# Patient Record
Sex: Male | Born: 1955 | Race: White | Hispanic: No | Marital: Single | Smoking: Former smoker
Health system: Southern US, Community
[De-identification: ages and names within clinical notes are randomized; demographics above are authoritative.]

## PROBLEM LIST (undated history)

## (undated) DIAGNOSIS — F209 Schizophrenia, unspecified: Secondary | ICD-10-CM

## (undated) DIAGNOSIS — E119 Type 2 diabetes mellitus without complications: Secondary | ICD-10-CM

## (undated) DIAGNOSIS — I10 Essential (primary) hypertension: Secondary | ICD-10-CM

## (undated) DIAGNOSIS — S88111A Complete traumatic amputation at level between knee and ankle, right lower leg, initial encounter: Secondary | ICD-10-CM

## (undated) DIAGNOSIS — R350 Frequency of micturition: Secondary | ICD-10-CM

## (undated) DIAGNOSIS — G629 Polyneuropathy, unspecified: Secondary | ICD-10-CM

## (undated) HISTORY — PX: BELOW KNEE LEG AMPUTATION: SUR23

---

## 2019-03-11 ENCOUNTER — Emergency Department (HOSPITAL_COMMUNITY)
Admission: EM | Admit: 2019-03-11 | Discharge: 2019-03-15 | Disposition: A | Discharge status: Home / Self Care | Payer: Medicare (Managed Care) | Attending: Emergency Medicine | Admitting: Emergency Medicine

## 2019-03-11 ENCOUNTER — Emergency Department (HOSPITAL_COMMUNITY): Payer: Medicare (Managed Care)

## 2019-03-11 ENCOUNTER — Encounter (HOSPITAL_COMMUNITY): Payer: Self-pay

## 2019-03-11 DIAGNOSIS — F333 Major depressive disorder, recurrent, severe with psychotic symptoms: Secondary | ICD-10-CM | POA: Diagnosis not present

## 2019-03-11 DIAGNOSIS — L97529 Non-pressure chronic ulcer of other part of left foot with unspecified severity: Secondary | ICD-10-CM | POA: Insufficient documentation

## 2019-03-11 DIAGNOSIS — R0600 Dyspnea, unspecified: Secondary | ICD-10-CM | POA: Diagnosis present

## 2019-03-11 DIAGNOSIS — Z5900 Homelessness unspecified: Secondary | ICD-10-CM

## 2019-03-11 DIAGNOSIS — R45851 Suicidal ideations: Secondary | ICD-10-CM

## 2019-03-11 DIAGNOSIS — Z20822 Contact with and (suspected) exposure to covid-19: Secondary | ICD-10-CM | POA: Diagnosis not present

## 2019-03-11 DIAGNOSIS — Z794 Long term (current) use of insulin: Secondary | ICD-10-CM | POA: Diagnosis not present

## 2019-03-11 DIAGNOSIS — E1140 Type 2 diabetes mellitus with diabetic neuropathy, unspecified: Secondary | ICD-10-CM | POA: Diagnosis not present

## 2019-03-11 DIAGNOSIS — Z79899 Other long term (current) drug therapy: Secondary | ICD-10-CM | POA: Insufficient documentation

## 2019-03-11 DIAGNOSIS — Z89511 Acquired absence of right leg below knee: Secondary | ICD-10-CM | POA: Diagnosis not present

## 2019-03-11 DIAGNOSIS — Z7951 Long term (current) use of inhaled steroids: Secondary | ICD-10-CM | POA: Insufficient documentation

## 2019-03-11 DIAGNOSIS — E11621 Type 2 diabetes mellitus with foot ulcer: Secondary | ICD-10-CM | POA: Diagnosis not present

## 2019-03-11 DIAGNOSIS — I10 Essential (primary) hypertension: Secondary | ICD-10-CM | POA: Diagnosis not present

## 2019-03-11 DIAGNOSIS — F17210 Nicotine dependence, cigarettes, uncomplicated: Secondary | ICD-10-CM | POA: Insufficient documentation

## 2019-03-11 DIAGNOSIS — E119 Type 2 diabetes mellitus without complications: Secondary | ICD-10-CM | POA: Insufficient documentation

## 2019-03-11 DIAGNOSIS — R0602 Shortness of breath: Secondary | ICD-10-CM

## 2019-03-11 LAB — COMPREHENSIVE METABOLIC PANEL
ALT: 14 U/L (ref 0–44)
AST: 13 U/L — ABNORMAL LOW (ref 15–41)
Albumin: 3.7 g/dL (ref 3.5–5.0)
Alkaline Phosphatase: 133 U/L — ABNORMAL HIGH (ref 38–126)
Anion gap: 11 (ref 5–15)
BUN: 19 mg/dL (ref 8–23)
CO2: 28 mmol/L (ref 22–32)
Calcium: 9.4 mg/dL (ref 8.9–10.3)
Chloride: 99 mmol/L (ref 98–111)
Creatinine, Ser: 1.04 mg/dL (ref 0.61–1.24)
GFR calc Af Amer: >60 mL/min (ref >60)
GFR calc non Af Amer: >60 mL/min (ref >60)
Glucose, Bld: 431 mg/dL — ABNORMAL HIGH (ref 70–99)
Potassium: 4.9 mmol/L (ref 3.5–5.1)
Sodium: 138 mmol/L (ref 135–145)
Total Bilirubin: 0.9 mg/dL (ref 0.3–1.2)
Total Protein: 8.5 g/dL — ABNORMAL HIGH (ref 6.5–8.1)

## 2019-03-11 LAB — CBC
HCT: 50 % (ref 39.0–52.0)
Hemoglobin: 16.7 g/dL (ref 13.0–17.0)
MCH: 29.5 pg (ref 26.0–34.0)
MCHC: 33.4 g/dL (ref 30.0–36.0)
MCV: 88.2 fL (ref 80.0–100.0)
Platelets: 298 10*3/uL (ref 150–400)
RBC: 5.67 MIL/uL (ref 4.22–5.81)
RDW: 12.4 % (ref 11.5–15.5)
WBC: 10.2 10*3/uL (ref 4.0–10.5)
nRBC: 0 % (ref 0.0–0.2)

## 2019-03-11 LAB — ACETAMINOPHEN LEVEL: Acetaminophen (Tylenol), Serum: <10 ug/mL — ABNORMAL LOW (ref 10–30)

## 2019-03-11 LAB — RESPIRATORY PANEL BY RT PCR (FLU A&B, COVID)
Influenza A by PCR: NEGATIVE
Influenza B by PCR: NEGATIVE
SARS Coronavirus 2 by RT PCR: NEGATIVE

## 2019-03-11 LAB — ETHANOL: Alcohol, Ethyl (B): <10 mg/dL (ref <10)

## 2019-03-11 LAB — CBG MONITORING, ED
Glucose-Capillary: 347 mg/dL — ABNORMAL HIGH (ref 70–99)
Glucose-Capillary: 288 mg/dL — ABNORMAL HIGH (ref 70–99)

## 2019-03-11 LAB — RAPID URINE DRUG SCREEN, HOSP PERFORMED
Amphetamines: NOT DETECTED
Barbiturates: NOT DETECTED
Benzodiazepines: NOT DETECTED
Cocaine: NOT DETECTED
Opiates: NOT DETECTED
Tetrahydrocannabinol: NOT DETECTED

## 2019-03-11 LAB — SALICYLATE LEVEL: Salicylate Lvl: <7 mg/dL — ABNORMAL LOW (ref 7.0–30.0)

## 2019-03-11 MED ORDER — BACLOFEN 10 MG PO TABS
10.0000 mg | ORAL_TABLET | Freq: Three times a day (TID) | ORAL | Status: DC | PRN
  Administered 2019-03-11 – 2019-03-14 (×7): 10 mg via ORAL
  Filled 2019-03-11 (×7): qty 1

## 2019-03-11 MED ORDER — GABAPENTIN 300 MG PO CAPS
600.0000 mg | ORAL_CAPSULE | Freq: Two times a day (BID) | ORAL | Status: DC | PRN
  Administered 2019-03-11 – 2019-03-15 (×8): 600 mg via ORAL
  Filled 2019-03-11 (×10): qty 2

## 2019-03-11 MED ORDER — CALCIUM CARBONATE ANTACID 500 MG PO CHEW: 1.0000 | CHEWABLE_TABLET | Freq: Two times a day (BID) | ORAL | Status: DC | PRN

## 2019-03-11 MED ORDER — FINASTERIDE 5 MG PO TABS
5.0000 mg | ORAL_TABLET | Freq: Every day | ORAL | Status: DC
  Administered 2019-03-11 – 2019-03-15 (×5): 5 mg via ORAL
  Filled 2019-03-11 (×5): qty 1

## 2019-03-11 MED ORDER — METFORMIN HCL 500 MG PO TABS
500.0000 mg | ORAL_TABLET | Freq: Every day | ORAL | Status: DC
  Administered 2019-03-12 – 2019-03-15 (×4): 500 mg via ORAL
  Filled 2019-03-11 (×4): qty 1

## 2019-03-11 MED ORDER — INSULIN ASPART 100 UNIT/ML ~~LOC~~ SOLN
15.0000 [IU] | Freq: Three times a day (TID) | SUBCUTANEOUS | Status: DC
  Administered 2019-03-11 – 2019-03-15 (×11): 15 [IU] via SUBCUTANEOUS
  Filled 2019-03-11: qty 0.15

## 2019-03-11 MED ORDER — MOMETASONE FURO-FORMOTEROL FUM 200-5 MCG/ACT IN AERO
2.0000 | INHALATION_SPRAY | Freq: Two times a day (BID) | RESPIRATORY_TRACT | Status: DC
  Administered 2019-03-12 – 2019-03-15 (×3): 2 via RESPIRATORY_TRACT
  Filled 2019-03-11: qty 8.8

## 2019-03-11 MED ORDER — NICOTINE POLACRILEX 2 MG MT GUM: 2.0000 mg | CHEWING_GUM | OROMUCOSAL | Status: DC | PRN

## 2019-03-11 MED ORDER — ACETAMINOPHEN 500 MG PO TABS
500.0000 mg | ORAL_TABLET | Freq: Four times a day (QID) | ORAL | Status: DC | PRN
  Administered 2019-03-11 – 2019-03-15 (×9): 500 mg via ORAL
  Filled 2019-03-11 (×9): qty 1

## 2019-03-11 MED ORDER — DULOXETINE HCL 30 MG PO CPEP
30.0000 mg | ORAL_CAPSULE | Freq: Every day | ORAL | Status: DC
  Administered 2019-03-11 – 2019-03-15 (×5): 30 mg via ORAL
  Filled 2019-03-11 (×6): qty 1

## 2019-03-11 MED ORDER — ALBUTEROL SULFATE HFA 108 (90 BASE) MCG/ACT IN AERS: 2.0000 | INHALATION_SPRAY | Freq: Four times a day (QID) | RESPIRATORY_TRACT | Status: DC | PRN

## 2019-03-11 MED ORDER — CARVEDILOL 12.5 MG PO TABS
12.5000 mg | ORAL_TABLET | Freq: Two times a day (BID) | ORAL | Status: DC
  Administered 2019-03-11 – 2019-03-15 (×7): 12.5 mg via ORAL
  Filled 2019-03-11 (×8): qty 1

## 2019-03-11 NOTE — ED Provider Notes (Addendum)
[facility] DEPT Provider Note   CSN: [id number] Arrival date & time: 03/11/19  1237     History Chief Complaint  Patient presents with  . Suicidal    Darren West is a 64 y.o. male.  HPI    Patient presents with some suicidal ideation.  On he is not a forthcoming historian, answers most questions with very brief answers, does not elaborate on duration of his condition.  Seems though the patient was in a hotel, and when asked to leave, was clear that he had suicidal ideation.  No report of trauma, fall.  Patient notes he has pain in his shoulder where there is known rotator cuff the lesion, denies other new pain.  He does acknowledge dyspnea, unclear onset, unclear characteristics. Patient is seemingly patient at the [facility], it is unclear where else he obtains medical care.  Per EMS the patient reported suicidal ideation at a local hotel, and was brought here for evaluation.   History not provided by the patient in regards to social, family, medical, surgical.  However, on pharmacy reconciliation as per the patient's take multiple medication consistent with a history of respiratory disease, pain, diabetes, hypertension.   Social History   Patient does not provide details of his social history, but it seems as though he was living in a hotel prior to today.   Tobacco Use  . Smoking status: Not on file  Substance Use Topics  . Alcohol use: Not on file  . Drug use: Not on file    Home Medications Prior to Admission medications   Medication Sig Start Date End Date Taking? Authorizing Provider  acetaminophen (TYLENOL) 500 MG tablet Take 500 mg by mouth every 6 (six) hours as needed for mild pain.   Yes [provider]  albuterol (VENTOLIN HFA) 108 (90 Base) MCG/ACT inhaler Inhale 2 puffs into the lungs every 6 (six) hours as needed for wheezing or shortness of breath.   Yes [provider]  baclofen (LIORESAL) 10 MG  tablet Take 10 mg by mouth 3 (three) times daily as needed for muscle spasms.   Yes [provider]  budesonide-formoterol (SYMBICORT) 160-4.5 MCG/ACT inhaler Inhale 2 puffs into the lungs 2 (two) times daily.   Yes [provider]  calcium carbonate (TUMS - DOSED IN MG ELEMENTAL CALCIUM) 500 MG chewable tablet Chew 1 tablet by mouth 2 (two) times daily as needed for indigestion or heartburn.   Yes [provider]  carvedilol (COREG) 25 MG tablet Take 12.5 mg by mouth every 12 (twelve) hours.   Yes [provider]  DULoxetine (CYMBALTA) 30 MG capsule Take 30 mg by mouth daily.   Yes [provider]  finasteride (PROSCAR) 5 MG tablet Take 5 mg by mouth daily.   Yes [provider]  gabapentin (NEURONTIN) 300 MG capsule Take 600 mg by mouth 2 (two) times daily as needed (pain).   Yes [provider]  insulin aspart (NOVOLOG) 100 UNIT/ML injection Inject 15 Units into the skin 3 (three) times daily before meals.   Yes [provider]  ipratropium-albuterol (DUONEB) 0.5-2.5 (3) MG/3ML SOLN Take 3 mLs by nebulization every 4 (four) hours as needed (wheezing/SOB).   Yes [provider]  metFORMIN (GLUCOPHAGE) 500 MG tablet Take 500 mg by mouth daily with breakfast.   Yes [provider]  nicotine polacrilex (COMMIT) 2 MG lozenge Take 2 mg by mouth as needed for smoking cessation.   Yes [provider]  OVER THE COUNTER MEDICATION Apply 1 application topically 4 (four) times daily as needed.   Yes [provider]  tamsulosin (FLOMAX) 0.4 MG CAPS capsule Take 0.4 mg by mouth daily after supper.   Yes [provider]    Allergies    Patient has no known allergies.  Review of Systems   Review of Systems  Unable to perform ROS: Psychiatric disorder    Physical Exam Updated Vital Signs BP (!) 167/113 (BP Location: Right Arm)   Pulse (!) 104   Temp 98 F (36.7 C) (Oral)   Resp 18    SpO2 95%   Physical Exam Vitals and nursing note reviewed.  Constitutional:      Appearance: He is well-developed.     Comments: Unwell appearing, though no distress adult male speaking briefly, minimally, with delay and only intermittently.  HENT:     Head: Normocephalic and atraumatic.  Eyes:     Conjunctiva/sclera: Conjunctivae normal.  Pulmonary:     Effort: Pulmonary effort is normal. No respiratory distress.     Breath sounds: No stridor.  Abdominal:     General: There is no distension.  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  Skin:    General: Skin is warm and dry.  Neurological:     Mental Status: He is alert and oriented to person, place, and time.  Psychiatric:        Thought Content: Thought content includes suicidal ideation.     ED Results / Procedures / Treatments   Labs (all labs ordered are listed, but only abnormal results are displayed) Labs Reviewed  COMPREHENSIVE METABOLIC PANEL - Abnormal; Notable for the following components:      Result Value   Glucose, Bld 431 (*)    Total Protein 8.5 (*)    AST 13 (*)    Alkaline Phosphatase 133 (*)    All other components within normal limits  SALICYLATE LEVEL - Abnormal; Notable for the following components:   Salicylate Lvl <7.0 (*)    All other components within normal limits  ACETAMINOPHEN LEVEL - Abnormal; Notable for the following components:   Acetaminophen (Tylenol), Serum <10 (*)    All other components within normal limits  RESPIRATORY PANEL BY RT PCR (FLU A&B, COVID)  ETHANOL  CBC  RAPID URINE DRUG SCREEN, HOSP PERFORMED     Radiology DG Chest Port 1 View  Result Date: 03/11/2019 CLINICAL DATA:  Pt presents with EMS with c/o SI. Per EMS, pt reports that he was at a hotel and hotel management wanted him gone. EMS reported that he told them he was SI when they attempted to make him leave. Pt is not very cooperative when an.*comment was truncated* EXAM:  PORTABLE CHEST 1 VIEW COMPARISON:  None. FINDINGS: Normal mediastinum and cardiac silhouette. Low lung volumes. Normal pulmonary vasculature. No evidence of effusion, infiltrate, or pneumothorax. No acute bony abnormality. IMPRESSION: No acute cardiopulmonary process. Electronically Signed   By: Genevive Bi M.D.   On: 03/11/2019 13:39    Procedures Procedures (including critical care time)  Medications Ordered in ED Medications - No data to display  ED Course  I have reviewed the triage vital signs and the nursing notes.  Pertinent labs & imaging results that were available during my care of the patient were reviewed by me and considered in my medical decision making (see chart for details).   Patient has hyperG, but no aniongap.  He was restarted  on his diabetes meds.    Large adult male presents with concern for suicidal ideation. On exam patient is in no distress, though is not particularly forthcoming with any details of today's episode.  Patient has no evidence for acute new physical disease, has no hemodynamic stability, labs are reassuring, though is noted, on medication reconciliation that he likely has multiple medical issues which will require ongoing care.  Patient has been medically cleared, however, for behavioral health evaluation for his description of suicidal ideation.  Final Clinical Impression(s) / ED Diagnoses Final diagnoses:  SOB (shortness of breath)  Suicidal ideation      Gerhard Munch, MD 03/11/19 3568    Gerhard Munch, MD 03/11/19 [phone]

## 2019-03-11 NOTE — ED Notes (Signed)
Pt has a wound on bottom left foot. He will not let staff touch or dress it.

## 2019-03-11 NOTE — ED Triage Notes (Addendum)
Pt presents with EMS with c/o SI. Per EMS, pt reports that he was at a hotel and hotel management wanted him gone. EMS reported that he told them he was SI when they attempted to make him leave. Pt is not very cooperative when answering questions, asking this RN to keep her voice down because he is on the phone. Pt does admit to SI.
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
## 2019-03-12 DIAGNOSIS — Z5900 Homelessness unspecified: Secondary | ICD-10-CM

## 2019-03-12 LAB — CBG MONITORING, ED
Glucose-Capillary: 324 mg/dL — ABNORMAL HIGH (ref 70–99)
Glucose-Capillary: 390 mg/dL — ABNORMAL HIGH (ref 70–99)
Glucose-Capillary: 276 mg/dL — ABNORMAL HIGH (ref 70–99)
Glucose-Capillary: 312 mg/dL — ABNORMAL HIGH (ref 70–99)

## 2019-03-12 MED ORDER — MUSCLE RUB 10-15 % EX CREA
TOPICAL_CREAM | CUTANEOUS | Status: DC | PRN
  Administered 2019-03-12: 1 via TOPICAL
  Filled 2019-03-12: qty 85

## 2019-03-12 NOTE — BH Assessment (Addendum)
[facility] Assessment Progress Note  Per Berneice Heinrich, FNP, this pt does not require psychiatric hospitalization at this time.  Pt is to be discharged from [facility] with recommendation to follow up with [facility].  This has been included in pt's discharge instructions, along with referral information for VA services in the area.  Pt's nurse, Waynetta Sandy, has been notified.  Doylene Canning, MA Triage Specialist [phone]

## 2019-03-12 NOTE — Progress Notes (Signed)
CSW updated the EDP on duty 2nd shift the the pt's supposed ride to a hotel in [place] is no longer in communication with the CSW.  CSW also updated the EPD that it is also hoped for but unconfirmed that pt's alleged payor for the pt's hotel room will pay for a room for the pt to go to in [place] once the pt's payor (friend who has agreed to pay per the 1st shift ED CSW) Darren West at ph: ph: [phone] gets off work at [employer].  EDP updated that per 1st shift EDP this was considered to be the plan for the pt but that it is as of now unconfirmed.  EDP will keep pt overnight for safety with the plan for the 1st shift ED CSW to follow up.  CSW was able to, with the help of the RN CM attain a walker for the pt.  Per pt's friend Darren West who is now no longer answering phone calls from the CSW the did have an electrical wheelchair prior to the pt coming to the ED.  Per pt's RN, pt has a prosthesis.  2nd shift ED CSW will leave handoff for 1st shift ED CSW.  CSW will continue to follow for D/C needs.  Darren Pea. Akelia Husted, LCSW, LCAS, CSI Transitions of Care Clinical Social Worker Care Coordination Department Ph: [phone]

## 2019-03-12 NOTE — ED Provider Notes (Signed)
Emergency Medicine Observation Re-evaluation Note  Darren West is a 64 y.o. male, seen on rounds today.  Pt initially presented to the ED for complaints of Suicidal Currently, the patient is sleeping.  Physical Exam  BP (!) 151/107 (BP Location: Right Arm)   Pulse 95   Temp 98.8 F (37.1 C) (Oral)   Resp 20   SpO2 93%  Physical Exam  ED Course / MDM  EKG:    I have reviewed the labs performed to date as well as medications administered while in observation.  Recent changes in the last 24 hours include:  none Plan  Current plan is for reevaluation this morning.    Sabas Sous, MD 03/12/19 [phone]

## 2019-03-12 NOTE — ED Provider Notes (Signed)
Patient is medically cleared to be discharged to a hotel later today or tomorrow morning.   Bethann Berkshire, MD 03/12/19 [phone]

## 2019-03-12 NOTE — Progress Notes (Signed)
CSW received a call from pt's friend Karen Chafe at ph: [phone].   states he would be willing to pay for a room if transport to the [facility] were to be provided on 03/10/19 at anytime after 3am.  CSW unable to contact pt';s friend Darryl who previously stated he would transport pt at ph: ph: [phone]  CSW will continue to follow for D/C needs.  Dorothe Pea. Tasmine Hipwell, LCSW, LCAS, CSI Transitions of Care Clinical Social Worker Care Coordination Department Ph: [phone]

## 2019-03-12 NOTE — Progress Notes (Addendum)
CSW viewed pt through the glass front of the pt's room immediately upon hearing CSW was a Education officer, museum pt's legs began moving with "involuntary jerking movements".  CSW met with pt who provided verbal permission for the CSW to contact his friends Darryl Quita Skye) Vertell Limber at ph: [phone] and Redmond Pulling at ph: [phone].    CSW spoke to pt's friend Darryl who works until 8 pm who confirmed that pt's friend Delight Stare works until [employer]  Per the pt/pt's friend Reita Cliche, pt's friend Delight Stare is to leave work at [employer] and to go in person Gwyndolyn Saxon supposedly refuses to provide a card over the phone) to the [facility] at 3 am to pay for the room in person for the pt.  Plan: 3 am: RN please call pt's friend Delight Stare at ph: ph: [phone] to see when the pt's hotel room in [place] will be paid for.  Pt's friend Darryl originally told the CSW that Darryl could transport the pt, but when the CSW called to confirm this Darryl hung up on the CSW and did not answer when the CSW called back X3.  CSW called/text vis HIPPA-compliant messaging that this plan is confirmed with Delight Stare and is waiting to hear back.  Of note: Per p/pt's friend Darryl, Mr. Delight Stare is working and cannot answer his phone until after 3 am.  CSW will continue to follow for D/C needs.  Alphonse Guild. Kiano Terrien, LCSW, LCAS, CSI Transitions of Care Clinical Social Worker Care Coordination Department Ph: [phone]

## 2019-03-12 NOTE — Discharge Instructions (Addendum)
For your behavioral health needs you are advised to follow up with one of the providers listed below at your earliest opportunity:         [facility]      [address]      [place], [place] [address]      [phone]      New patients are seen at their walk-in clinic.  Walk-in hours are Monday - Friday from 8:30 am - 12:00 pm, and from 1:00 pm - 2:30 pm.  Walk-in patients are seen on a first come, first served basis, so try to arrive as early as possible for the best chance of being seen the same day.       [facility]      [address] [place], [place] [address]      [phone]  For supportive services for veterans in the [place] area, contact the [facility]:       [facility]      [address], [address]      [place], [place] [address]      [phone]      Hours of operation: 8:00 am - 7:00 pm, Monday - Friday  For crisis services for veterans, call the [employer], available 24 hours a day, 7 days a week:       [employer] [facility]      [phone]

## 2019-03-12 NOTE — Progress Notes (Addendum)
Assessed left ball of foot diabetic ulcer. Dr. Preston Fleeting came to assess the foot ulcer as well. He stated that we wound not place any additional orders for the wound but to keep an eye on the ulcer.

## 2019-03-12 NOTE — Progress Notes (Addendum)
1:30p CSW assisting patient in trying to get in contact with family to help him find a place to stay. Patient gave CSW permissions to speak to a friend Amada Jupiter [phone]) who reports he has spoke with patient's other friend Andrey Campanile [phone]) and Andrey Campanile is going to help patient get a room at the [facility]. CSW spoke with Andrey Campanile who confirmed this, but reports he has not spoken with the hotel and the room has not been paid for. Andrey Campanile reports patient has family who lives in [place] who may be able to come pick patient up. Andrey Campanile reports he is unable to pick patient up as he is on his way to work and Amada Jupiter is currently at work. CSW to have patient try to call family in the meantime. Patient may not be safe to just be transported to the [facility] in without things being in place. Will continue to follow up.  10:46a TOC consult received regarding patient needing assistance with homeless resources. CSW contacted [facility] prior to speaking with patient and was informed that there are no current male shelter beds available. CSW spoke with patient at bedside. When CSW introduced herself and asked patient about his needs patient stated that his bank in [place] is going to forward him money so he can "find somewhere to stay in this [place] town." When CSW asked further questions about when he will get the money and where he plans on going patient became verbally agitated. 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 RN report she will attempt to go speak to patient again once he calms down and follow up with CSW. Will continue to follow up.   Geralyn Corwin, LCSW Transitions of Care Department [facility] ED [phone]

## 2019-03-12 NOTE — ED Notes (Signed)
Pt became very agitated at the writer, when told that his friend would not be picking him up this evening. The pt throw the phone and began to be verbally  aggressive. I will continue to monitor and redirect the pt. Christiane Ha from social work did come to speak with the pt, and he now  seems to have a understanding about his situation at this time and has accepted that he must stay over night.

## 2019-03-12 NOTE — Progress Notes (Addendum)
CSW viewed pt through the glass and pt was resting comfortably and seemingly asleep.  CSW entered the room, woke the pt up and reintroduced himself as the Child psychotherapist.  Immediately pt's body and leg (pt's right leg BTK amputation) began "involuntarily" moving in a jerking motion.  CSW updated pt who provided CSW with the following contacts:  1. Pt's aunt Inis Sizer at ph: [phone]  CSW called pt's aunt and each time the phone was answered but no one spoke or answered the CSW's queries.  2. Pt's brother Jeralyn Bennett at ph: [phone]  CSW called pt's brother Jeralyn Bennett and left a HIPPA-compliant VM requesting a call back.  CSW will continue to follow for D/C needs.  Dorothe Pea. Glenyce Randle, LCSW, LCAS, CSI Transitions of Care Clinical Social Worker Care Coordination Department Ph: [phone]
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
## 2019-03-13 LAB — CBG MONITORING, ED
Glucose-Capillary: 266 mg/dL — ABNORMAL HIGH (ref 70–99)
Glucose-Capillary: 380 mg/dL — ABNORMAL HIGH (ref 70–99)
Glucose-Capillary: 294 mg/dL — ABNORMAL HIGH (ref 70–99)

## 2019-03-13 NOTE — Progress Notes (Signed)
03/13/2019  1215  Tried calling patient's friend [phone] to see about what time he plans on coming to get patient. Patient's friend did not answer.

## 2019-03-13 NOTE — Progress Notes (Addendum)
1pm: CSW made two additional attempts to reach the patient's friend Chrissie Noa without success, an additional voicemail was left.  8:45am: CSW attempted to reach patient's friend Karen Chafe at [phone] without success, a voicemail was left requesting a return call.  Edwin Dada, MSW, LCSW-A Transitions of Care  Clinical Social Worker  [facility] Emergency Departments  Medical ICU [phone]

## 2019-03-14 LAB — CBG MONITORING, ED
Glucose-Capillary: 293 mg/dL — ABNORMAL HIGH (ref 70–99)
Glucose-Capillary: 393 mg/dL — ABNORMAL HIGH (ref 70–99)
Glucose-Capillary: 307 mg/dL — ABNORMAL HIGH (ref 70–99)
Glucose-Capillary: 379 mg/dL — ABNORMAL HIGH (ref 70–99)

## 2019-03-14 MED ORDER — ZOLPIDEM TARTRATE 5 MG PO TABS
5.0000 mg | ORAL_TABLET | Freq: Every evening | ORAL | Status: DC | PRN
  Administered 2019-03-14 (×2): 5 mg via ORAL
  Filled 2019-03-14 (×2): qty 1

## 2019-03-14 NOTE — ED Notes (Signed)
MD aware of CBG 379.

## 2019-03-14 NOTE — Progress Notes (Addendum)
8:45am- CSW attempted to reach patient's friend Karen Chafe at [phone]. The call was unanswered, CSW left a voicemail requesting a callback.  8:50am- CSW attempted to reach patient's friend Darryl at [phone].The call was unanswered, CSW left a voicemail requesting a callback.  Chrissie Noa previously offered to pay for a hotel and Darryl previously offered to provide transportation for this patient to [place], [place] at discharge.  8:55am- CSW attempted to reach [facility]. The call was unanswered and the mailbox is unable to accept new messages at this time. Shelters do not typically facilitate intake on weekends.  9:40am- CSW left voicemail for [facility] Supervisor on call, Jiles Crocker.   9:50am- CSW received a returned call from [facility] Supervisor, Jiles Crocker. Case was staffed with [facility] Supervisor who states there needs to be a safe discharge plan for this patient. CSW was encouraged to continue contact attempts to reach supports in an effort to facilitate a safe discharge.     1:50pm- CSW attempted to reach patient's friend Karen Chafe at [phone]. The call was unanswered, the voicemail box is now full and CSW could not leave a message.  1:55pm-  CSW attempted to reach [facility]. The call was unanswered and the mailbox is unable to accept new messages at this time.     3:35pm- CSW attempted to reach patient's friend Karen Chafe at [phone]. The call was unanswered, the voicemail box is now full and CSW could not leave a message.  3:35pm-  CSW attempted to reach [facility]. The call was unanswered and the mailbox is unable to accept new messages at this time.   CSW following.  Enid Cutter, MSW, LCSW-A Clinical Social Worker

## 2019-03-15 ENCOUNTER — Other Ambulatory Visit: Payer: Self-pay

## 2019-03-15 ENCOUNTER — Emergency Department (HOSPITAL_COMMUNITY)
Admission: EM | Admit: 2019-03-15 | Discharge: 2019-03-16 | Disposition: A | Discharge status: Home / Self Care | Payer: Medicare (Managed Care) | Source: Home / Self Care | Attending: Emergency Medicine | Admitting: Emergency Medicine

## 2019-03-15 ENCOUNTER — Emergency Department (HOSPITAL_COMMUNITY)
Admission: EM | Admit: 2019-03-15 | Discharge: 2019-03-15 | Payer: Medicare (Managed Care) | Source: Home / Self Care | Attending: Emergency Medicine | Admitting: Emergency Medicine

## 2019-03-15 ENCOUNTER — Encounter (HOSPITAL_COMMUNITY): Payer: Self-pay | Admitting: Emergency Medicine

## 2019-03-15 ENCOUNTER — Ambulatory Visit (HOSPITAL_COMMUNITY): Admission: RE | Admit: 2019-03-15 | Payer: Medicare (Managed Care) | Source: Home / Self Care | Admitting: Psychiatry

## 2019-03-15 DIAGNOSIS — Z794 Long term (current) use of insulin: Secondary | ICD-10-CM | POA: Insufficient documentation

## 2019-03-15 DIAGNOSIS — F333 Major depressive disorder, recurrent, severe with psychotic symptoms: Secondary | ICD-10-CM | POA: Insufficient documentation

## 2019-03-15 DIAGNOSIS — T887XXA Unspecified adverse effect of drug or medicament, initial encounter: Secondary | ICD-10-CM

## 2019-03-15 DIAGNOSIS — Z79899 Other long term (current) drug therapy: Secondary | ICD-10-CM | POA: Insufficient documentation

## 2019-03-15 DIAGNOSIS — R45851 Suicidal ideations: Secondary | ICD-10-CM | POA: Insufficient documentation

## 2019-03-15 DIAGNOSIS — R739 Hyperglycemia, unspecified: Secondary | ICD-10-CM

## 2019-03-15 DIAGNOSIS — E119 Type 2 diabetes mellitus without complications: Secondary | ICD-10-CM | POA: Insufficient documentation

## 2019-03-15 DIAGNOSIS — Z89511 Acquired absence of right leg below knee: Secondary | ICD-10-CM | POA: Insufficient documentation

## 2019-03-15 DIAGNOSIS — R441 Visual hallucinations: Secondary | ICD-10-CM

## 2019-03-15 DIAGNOSIS — Z590 Homelessness: Secondary | ICD-10-CM | POA: Insufficient documentation

## 2019-03-15 DIAGNOSIS — Z765 Malingerer [conscious simulation]: Secondary | ICD-10-CM | POA: Insufficient documentation

## 2019-03-15 HISTORY — DX: Frequency of micturition: R35.0

## 2019-03-15 HISTORY — DX: Type 2 diabetes mellitus without complications: E11.9

## 2019-03-15 HISTORY — DX: Essential (primary) hypertension: I10

## 2019-03-15 HISTORY — DX: Schizophrenia, unspecified: F20.9

## 2019-03-15 HISTORY — DX: Polyneuropathy, unspecified: G62.9

## 2019-03-15 HISTORY — DX: Complete traumatic amputation at level between knee and ankle, right lower leg, initial encounter: S88.111A

## 2019-03-15 LAB — SALICYLATE LEVEL: Salicylate Lvl: <7 mg/dL — ABNORMAL LOW (ref 7.0–30.0)

## 2019-03-15 LAB — COMPREHENSIVE METABOLIC PANEL
ALT: 10 U/L (ref 0–44)
AST: 11 U/L — ABNORMAL LOW (ref 15–41)
Albumin: 3.2 g/dL — ABNORMAL LOW (ref 3.5–5.0)
Alkaline Phosphatase: 116 U/L (ref 38–126)
Anion gap: 12 (ref 5–15)
BUN: 26 mg/dL — ABNORMAL HIGH (ref 8–23)
CO2: 24 mmol/L (ref 22–32)
Calcium: 9.4 mg/dL (ref 8.9–10.3)
Chloride: 94 mmol/L — ABNORMAL LOW (ref 98–111)
Creatinine, Ser: 1.25 mg/dL — ABNORMAL HIGH (ref 0.61–1.24)
GFR calc Af Amer: >60 mL/min (ref >60)
GFR calc non Af Amer: >60 mL/min (ref >60)
Glucose, Bld: 505 mg/dL (ref 70–99)
Potassium: 5.1 mmol/L (ref 3.5–5.1)
Sodium: 130 mmol/L — ABNORMAL LOW (ref 135–145)
Total Bilirubin: 0.8 mg/dL (ref 0.3–1.2)
Total Protein: 7.3 g/dL (ref 6.5–8.1)

## 2019-03-15 LAB — CBC
HCT: 50.5 % (ref 39.0–52.0)
Hemoglobin: 16.9 g/dL (ref 13.0–17.0)
MCH: 29.6 pg (ref 26.0–34.0)
MCHC: 33.5 g/dL (ref 30.0–36.0)
MCV: 88.4 fL (ref 80.0–100.0)
Platelets: 303 10*3/uL (ref 150–400)
RBC: 5.71 MIL/uL (ref 4.22–5.81)
RDW: 12.4 % (ref 11.5–15.5)
WBC: 9.2 10*3/uL (ref 4.0–10.5)
nRBC: 0 % (ref 0.0–0.2)

## 2019-03-15 LAB — CBG MONITORING, ED: Glucose-Capillary: 341 mg/dL — ABNORMAL HIGH (ref 70–99)

## 2019-03-15 LAB — ACETAMINOPHEN LEVEL: Acetaminophen (Tylenol), Serum: <10 ug/mL — ABNORMAL LOW (ref 10–30)

## 2019-03-15 LAB — ETHANOL: Alcohol, Ethyl (B): <10 mg/dL (ref <10)

## 2019-03-15 NOTE — ED Notes (Signed)
Pt asked to move to recliner because of fall concerns, pt refused and stayed in wheelchair

## 2019-03-15 NOTE — Discharge Instructions (Addendum)
Follow up as previously directed

## 2019-03-15 NOTE — ED Provider Notes (Signed)
Emergency Medicine Observation Re-evaluation Note  Darren West is a 64 y.o. male, seen on rounds today.  Pt initially presented to the ED for complaints of Suicidal Currently, the patient is stable Physical Exam  BP (!) 132/95 (BP Location: Right Wrist)   Pulse 85   Temp 98 F (36.7 C) (Oral)   Resp 18   SpO2 95%  Physical Exam Alert and in no distress ED Course / MDM  EKG:     Plan  placement   Darren Berkshire, MD 03/15/19 [phone]

## 2019-03-15 NOTE — ED Triage Notes (Signed)
Per [facility] pt picked up in front of [facility] for feeling like he was going to pass out. Pt told EMS that around 1pm today he took 2 Cymbalta and 2 Baclofen for the shoulder pain.  Vitals: 111/84, 88HR, 97%, temp 97.3, CBg 185.  EMS states that they were told by charge RN that pt is Covid +.
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
## 2019-03-15 NOTE — ED Notes (Signed)
Pt asked Social work to discharge him to the street.  He stated he does not want to go to a shelter.

## 2019-03-15 NOTE — ED Notes (Signed)
Pt was asking about shelter that social work had mentioned to him when he was seen here earlier today about getting into. This RN called Christiane Ha with social work asking about shelter resources for patient. Was informed that when patient was offered shelter services earlier today when here, pt refused and demanded to be discharged, so there are not any available resources for him at this time.
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
## 2019-03-15 NOTE — ED Triage Notes (Addendum)
Pt coming by EMS after calling the ambulance when he got out of jail, stated he had no where to go. Went to jail due to being seen at [facility], and allegedly got agitated because an RN asked him to pull up mask, and "spit on her" per EMS. States he has a plan to cut himself. Does not have access to a knife. From [place] and has no family here. Hx of schizophrenia. Previous note from today noted that pt is COVID+. Vitals WDL. Pt also has an ulcer to left foot
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
## 2019-03-15 NOTE — ED Notes (Signed)
Pt discharged as requested per patient.  All belongings were returned to patient.  Pt was in no distress at discharge and refused vitals and to sign discharge papers.  Pt is very hostile.

## 2019-03-15 NOTE — Progress Notes (Addendum)
9:11a CSW spoke with Weston Brass with [facility] in [place]. Weston Brass reports they have available beds and patient will need to report there at 4:30p for a bed. CSW spoke with patient about this as a discharge option and patient stated "I don't want to go to [facility]. I feel like I am a slave to you all, ya'll are not being helpful. I don't want to go to no shelter to be a slave to them or a slave to you all. Just let me charge my phone and I will go out here and figure it out myself." Patient asked to make a phone call and again stated he wants to leave. RN then spoke with patient and patient stated "I need my phone and my clothes. I am ready to be discharged." Patient then started yelling and cussing at staff from his room. CSW staffed this with [facility] Advance Care Supervisor who states if patient wants to be discharged patient has that right. CSW informed RN. Patient to be discharged and make his own arrangements.   8:46a CSW attempt to contact patient's supports [place] and [place]. CSW did not receive an answer and left a VM for both. CSW also contacted [facility] who reports there are no male shelter beds available. CSW to continue to follow up.   Geralyn Corwin, LCSW Transitions of Care Department [facility] ED [phone]

## 2019-03-16 LAB — URINALYSIS, ROUTINE W REFLEX MICROSCOPIC
Bacteria, UA: NONE SEEN
Bilirubin Urine: NEGATIVE
Glucose, UA: >=500 mg/dL — AB
Hgb urine dipstick: NEGATIVE
Ketones, ur: 5 mg/dL — AB
Leukocytes,Ua: NEGATIVE
Nitrite: NEGATIVE
Protein, ur: >=300 mg/dL — AB
Specific Gravity, Urine: 1.029 (ref 1.005–1.030)
pH: 6 (ref 5.0–8.0)

## 2019-03-16 LAB — RAPID URINE DRUG SCREEN, HOSP PERFORMED
Amphetamines: NOT DETECTED
Barbiturates: NOT DETECTED
Benzodiazepines: NOT DETECTED
Cocaine: NOT DETECTED
Opiates: NOT DETECTED
Tetrahydrocannabinol: NOT DETECTED

## 2019-03-16 LAB — CBG MONITORING, ED
Glucose-Capillary: 438 mg/dL — ABNORMAL HIGH (ref 70–99)
Glucose-Capillary: 368 mg/dL — ABNORMAL HIGH (ref 70–99)
Glucose-Capillary: 348 mg/dL — ABNORMAL HIGH (ref 70–99)

## 2019-03-16 MED ORDER — MOMETASONE FURO-FORMOTEROL FUM 200-5 MCG/ACT IN AERO: 2.0000 | INHALATION_SPRAY | Freq: Two times a day (BID) | RESPIRATORY_TRACT | Status: DC

## 2019-03-16 MED ORDER — FINASTERIDE 5 MG PO TABS
5.0000 mg | ORAL_TABLET | Freq: Every day | ORAL | Status: DC
  Filled 2019-03-16: qty 1

## 2019-03-16 MED ORDER — CALCIUM CARBONATE ANTACID 500 MG PO CHEW: 1.0000 | CHEWABLE_TABLET | Freq: Two times a day (BID) | ORAL | Status: DC | PRN

## 2019-03-16 MED ORDER — SODIUM CHLORIDE 0.9 % IV BOLUS (SEPSIS)
1000.0000 mL | Freq: Once | INTRAVENOUS | Status: AC
  Administered 2019-03-16: 1000 mL via INTRAVENOUS

## 2019-03-16 MED ORDER — ACETAMINOPHEN 500 MG PO TABS: 500.0000 mg | ORAL_TABLET | Freq: Four times a day (QID) | ORAL | Status: DC | PRN

## 2019-03-16 MED ORDER — INSULIN ASPART 100 UNIT/ML ~~LOC~~ SOLN: 15.0000 [IU] | Freq: Three times a day (TID) | SUBCUTANEOUS | Status: DC

## 2019-03-16 MED ORDER — METFORMIN HCL 500 MG PO TABS: 500.0000 mg | ORAL_TABLET | Freq: Every day | ORAL | Status: DC

## 2019-03-16 MED ORDER — ALBUTEROL SULFATE HFA 108 (90 BASE) MCG/ACT IN AERS: 2.0000 | INHALATION_SPRAY | Freq: Four times a day (QID) | RESPIRATORY_TRACT | Status: DC | PRN

## 2019-03-16 MED ORDER — CARVEDILOL 12.5 MG PO TABS
12.5000 mg | ORAL_TABLET | Freq: Two times a day (BID) | ORAL | Status: DC
  Administered 2019-03-16: 12.5 mg via ORAL
  Filled 2019-03-16: qty 1

## 2019-03-16 MED ORDER — DULOXETINE HCL 30 MG PO CPEP
30.0000 mg | ORAL_CAPSULE | Freq: Every day | ORAL | Status: DC
  Filled 2019-03-16: qty 1

## 2019-03-16 MED ORDER — TAMSULOSIN HCL 0.4 MG PO CAPS: 0.4000 mg | ORAL_CAPSULE | Freq: Every day | ORAL | Status: DC

## 2019-03-16 MED ORDER — INSULIN ASPART 100 UNIT/ML ~~LOC~~ SOLN: 10.0000 [IU] | Freq: Once | SUBCUTANEOUS | Status: DC

## 2019-03-16 MED ORDER — NICOTINE POLACRILEX 2 MG MT GUM: 2.0000 mg | CHEWING_GUM | OROMUCOSAL | Status: DC | PRN

## 2019-03-16 MED ORDER — BACLOFEN 10 MG PO TABS: 10.0000 mg | ORAL_TABLET | Freq: Three times a day (TID) | ORAL | Status: DC | PRN

## 2019-03-16 MED ORDER — IPRATROPIUM-ALBUTEROL 0.5-2.5 (3) MG/3ML IN SOLN: 3.0000 mL | RESPIRATORY_TRACT | Status: DC | PRN

## 2019-03-16 MED ORDER — GABAPENTIN 300 MG PO CAPS: 600.0000 mg | ORAL_CAPSULE | Freq: Two times a day (BID) | ORAL | Status: DC | PRN
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
## 2019-03-16 NOTE — Progress Notes (Signed)
Patient ID: Darren West, male   DOB: 1955/07/12, 64 y.o.   MRN: [id number]   Updated nurse on disposition. Recommended social work consult to assist with housing needs. Patient psychiatrically cleared.

## 2019-03-16 NOTE — ED Notes (Signed)
Pt wanded by security. 

## 2019-03-16 NOTE — Progress Notes (Signed)
CSW spoke with patient at bedside to determine if he was willing to go to [facility] in [place]. Patient stated initial agreement but requested to make a phone call prior to being discharged.  CSW revisited with patient and he is agreeable to go via cab to shelter in [facility].  CSW provided RN Clydie Braun with cab voucher for patient.   Edwin Dada, MSW, LCSW-A Transitions of Care  Clinical Social Worker  [facility] Emergency Departments  Medical ICU [phone]

## 2019-03-16 NOTE — ED Notes (Signed)
Lunch Tray Ordered @ 1038.  

## 2019-03-16 NOTE — ED Notes (Signed)
Per [facility] - pt is psych cleared- will request social work consult.
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
## 2019-03-16 NOTE — ED Notes (Signed)
Pt talking with TTS at present
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
## 2019-03-16 NOTE — ED Notes (Signed)
Psych MD to see in AM   Breakfast ordered, Darren West

## 2019-03-27 ENCOUNTER — Emergency Department (HOSPITAL_COMMUNITY): Payer: Medicare (Managed Care)

## 2019-03-27 ENCOUNTER — Inpatient Hospital Stay (HOSPITAL_COMMUNITY): Payer: Medicare (Managed Care)

## 2019-03-27 ENCOUNTER — Encounter (HOSPITAL_COMMUNITY): Payer: Self-pay

## 2019-03-27 ENCOUNTER — Other Ambulatory Visit: Payer: Self-pay

## 2019-03-27 ENCOUNTER — Inpatient Hospital Stay (HOSPITAL_COMMUNITY)
Admission: EM | Admit: 2019-03-27 | Discharge: 2019-04-02 | DRG: 871 | Disposition: A | Discharge status: Home / Self Care | Payer: Medicare (Managed Care) | Attending: Internal Medicine | Admitting: Internal Medicine

## 2019-03-27 DIAGNOSIS — J189 Pneumonia, unspecified organism: Secondary | ICD-10-CM | POA: Diagnosis present

## 2019-03-27 DIAGNOSIS — A419 Sepsis, unspecified organism: Principal | ICD-10-CM | POA: Diagnosis present

## 2019-03-27 DIAGNOSIS — E860 Dehydration: Secondary | ICD-10-CM | POA: Diagnosis present

## 2019-03-27 DIAGNOSIS — E1100 Type 2 diabetes mellitus with hyperosmolarity without nonketotic hyperglycemic-hyperosmolar coma (NKHHC): Secondary | ICD-10-CM | POA: Diagnosis present

## 2019-03-27 DIAGNOSIS — E1140 Type 2 diabetes mellitus with diabetic neuropathy, unspecified: Secondary | ICD-10-CM | POA: Diagnosis present

## 2019-03-27 DIAGNOSIS — Z89511 Acquired absence of right leg below knee: Secondary | ICD-10-CM

## 2019-03-27 DIAGNOSIS — Y95 Nosocomial condition: Secondary | ICD-10-CM | POA: Diagnosis present

## 2019-03-27 DIAGNOSIS — G9341 Metabolic encephalopathy: Secondary | ICD-10-CM | POA: Diagnosis present

## 2019-03-27 DIAGNOSIS — F209 Schizophrenia, unspecified: Secondary | ICD-10-CM | POA: Diagnosis present

## 2019-03-27 DIAGNOSIS — Z6834 Body mass index (BMI) 34.0-34.9, adult: Secondary | ICD-10-CM

## 2019-03-27 DIAGNOSIS — Z79899 Other long term (current) drug therapy: Secondary | ICD-10-CM

## 2019-03-27 DIAGNOSIS — N400 Enlarged prostate without lower urinary tract symptoms: Secondary | ICD-10-CM | POA: Diagnosis present

## 2019-03-27 DIAGNOSIS — B999 Unspecified infectious disease: Secondary | ICD-10-CM

## 2019-03-27 DIAGNOSIS — Z452 Encounter for adjustment and management of vascular access device: Secondary | ICD-10-CM

## 2019-03-27 DIAGNOSIS — Z7951 Long term (current) use of inhaled steroids: Secondary | ICD-10-CM

## 2019-03-27 DIAGNOSIS — R739 Hyperglycemia, unspecified: Secondary | ICD-10-CM | POA: Diagnosis present

## 2019-03-27 DIAGNOSIS — Z765 Malingerer [conscious simulation]: Secondary | ICD-10-CM | POA: Diagnosis not present

## 2019-03-27 DIAGNOSIS — F329 Major depressive disorder, single episode, unspecified: Secondary | ICD-10-CM | POA: Diagnosis present

## 2019-03-27 DIAGNOSIS — E1165 Type 2 diabetes mellitus with hyperglycemia: Secondary | ICD-10-CM

## 2019-03-27 DIAGNOSIS — L97421 Non-pressure chronic ulcer of left heel and midfoot limited to breakdown of skin: Secondary | ICD-10-CM | POA: Diagnosis present

## 2019-03-27 DIAGNOSIS — I10 Essential (primary) hypertension: Secondary | ICD-10-CM | POA: Diagnosis present

## 2019-03-27 DIAGNOSIS — Z590 Homelessness: Secondary | ICD-10-CM | POA: Diagnosis not present

## 2019-03-27 DIAGNOSIS — E08621 Diabetes mellitus due to underlying condition with foot ulcer: Secondary | ICD-10-CM | POA: Diagnosis not present

## 2019-03-27 DIAGNOSIS — L03116 Cellulitis of left lower limb: Secondary | ICD-10-CM | POA: Diagnosis present

## 2019-03-27 DIAGNOSIS — I4891 Unspecified atrial fibrillation: Secondary | ICD-10-CM

## 2019-03-27 DIAGNOSIS — Z20822 Contact with and (suspected) exposure to covid-19: Secondary | ICD-10-CM | POA: Diagnosis present

## 2019-03-27 DIAGNOSIS — E669 Obesity, unspecified: Secondary | ICD-10-CM | POA: Diagnosis present

## 2019-03-27 DIAGNOSIS — L97529 Non-pressure chronic ulcer of other part of left foot with unspecified severity: Secondary | ICD-10-CM

## 2019-03-27 DIAGNOSIS — G546 Phantom limb syndrome with pain: Secondary | ICD-10-CM | POA: Diagnosis present

## 2019-03-27 DIAGNOSIS — E11628 Type 2 diabetes mellitus with other skin complications: Secondary | ICD-10-CM | POA: Diagnosis present

## 2019-03-27 DIAGNOSIS — E11621 Type 2 diabetes mellitus with foot ulcer: Secondary | ICD-10-CM | POA: Diagnosis not present

## 2019-03-27 DIAGNOSIS — E13621 Other specified diabetes mellitus with foot ulcer: Secondary | ICD-10-CM

## 2019-03-27 DIAGNOSIS — L97429 Non-pressure chronic ulcer of left heel and midfoot with unspecified severity: Secondary | ICD-10-CM | POA: Diagnosis not present

## 2019-03-27 DIAGNOSIS — Z915 Personal history of self-harm: Secondary | ICD-10-CM

## 2019-03-27 DIAGNOSIS — IMO0002 Reserved for concepts with insufficient information to code with codable children: Secondary | ICD-10-CM

## 2019-03-27 DIAGNOSIS — L97422 Non-pressure chronic ulcer of left heel and midfoot with fat layer exposed: Secondary | ICD-10-CM | POA: Diagnosis not present

## 2019-03-27 DIAGNOSIS — Z794 Long term (current) use of insulin: Secondary | ICD-10-CM | POA: Diagnosis not present

## 2019-03-27 DIAGNOSIS — E872 Acidosis: Secondary | ICD-10-CM | POA: Diagnosis present

## 2019-03-27 LAB — GLUCOSE, CAPILLARY
Glucose-Capillary: 500 mg/dL — ABNORMAL HIGH (ref 70–99)
Glucose-Capillary: >600 mg/dL (ref 70–99)
Glucose-Capillary: 565 mg/dL (ref 70–99)
Glucose-Capillary: >600 mg/dL (ref 70–99)
Glucose-Capillary: 368 mg/dL — ABNORMAL HIGH (ref 70–99)
Glucose-Capillary: 282 mg/dL — ABNORMAL HIGH (ref 70–99)
Glucose-Capillary: 211 mg/dL — ABNORMAL HIGH (ref 70–99)
Glucose-Capillary: 260 mg/dL — ABNORMAL HIGH (ref 70–99)

## 2019-03-27 LAB — BASIC METABOLIC PANEL
Anion gap: 11 (ref 5–15)
BUN: 23 mg/dL (ref 8–23)
CO2: 26 mmol/L (ref 22–32)
Calcium: 8.9 mg/dL (ref 8.9–10.3)
Chloride: 112 mmol/L — ABNORMAL HIGH (ref 98–111)
Creatinine, Ser: 1.1 mg/dL (ref 0.61–1.24)
GFR calc Af Amer: >60 mL/min (ref >60)
GFR calc non Af Amer: >60 mL/min (ref >60)
Glucose, Bld: 459 mg/dL — ABNORMAL HIGH (ref 70–99)
Potassium: 3.3 mmol/L — ABNORMAL LOW (ref 3.5–5.1)
Sodium: 149 mmol/L — ABNORMAL HIGH (ref 135–145)

## 2019-03-27 LAB — APTT: aPTT: 29 seconds (ref 24–36)

## 2019-03-27 LAB — RAPID URINE DRUG SCREEN, HOSP PERFORMED
Amphetamines: NOT DETECTED
Barbiturates: NOT DETECTED
Benzodiazepines: NOT DETECTED
Cocaine: NOT DETECTED
Opiates: NOT DETECTED
Tetrahydrocannabinol: NOT DETECTED

## 2019-03-27 LAB — URINALYSIS, ROUTINE W REFLEX MICROSCOPIC
Bilirubin Urine: NEGATIVE
Glucose, UA: >=500 mg/dL — AB
Ketones, ur: 5 mg/dL — AB
Leukocytes,Ua: NEGATIVE
Nitrite: NEGATIVE
Protein, ur: 100 mg/dL — AB
Specific Gravity, Urine: 1.031 — ABNORMAL HIGH (ref 1.005–1.030)
pH: 6 (ref 5.0–8.0)

## 2019-03-27 LAB — COMPREHENSIVE METABOLIC PANEL
ALT: 12 U/L (ref 0–44)
AST: 12 U/L — ABNORMAL LOW (ref 15–41)
Albumin: 3.9 g/dL (ref 3.5–5.0)
Alkaline Phosphatase: 172 U/L — ABNORMAL HIGH (ref 38–126)
Anion gap: 13 (ref 5–15)
BUN: 27 mg/dL — ABNORMAL HIGH (ref 8–23)
CO2: 29 mmol/L (ref 22–32)
Calcium: 9.9 mg/dL (ref 8.9–10.3)
Chloride: 104 mmol/L (ref 98–111)
Creatinine, Ser: 1.17 mg/dL (ref 0.61–1.24)
GFR calc Af Amer: >60 mL/min (ref >60)
GFR calc non Af Amer: >60 mL/min (ref >60)
Glucose, Bld: 733 mg/dL (ref 70–99)
Potassium: 3.9 mmol/L (ref 3.5–5.1)
Sodium: 146 mmol/L — ABNORMAL HIGH (ref 135–145)
Total Bilirubin: 0.7 mg/dL (ref 0.3–1.2)
Total Protein: 8.9 g/dL — ABNORMAL HIGH (ref 6.5–8.1)

## 2019-03-27 LAB — CBC WITH DIFFERENTIAL/PLATELET
Abs Immature Granulocytes: 0.05 10*3/uL (ref 0.00–0.07)
Basophils Absolute: 0 10*3/uL (ref 0.0–0.1)
Basophils Relative: 1 %
Eosinophils Absolute: 0 10*3/uL (ref 0.0–0.5)
Eosinophils Relative: 0 %
HCT: 50.2 % (ref 39.0–52.0)
Hemoglobin: 16.3 g/dL (ref 13.0–17.0)
Immature Granulocytes: 1 %
Lymphocytes Relative: 13 %
Lymphs Abs: 1.1 10*3/uL (ref 0.7–4.0)
MCH: 29.4 pg (ref 26.0–34.0)
MCHC: 32.5 g/dL (ref 30.0–36.0)
MCV: 90.5 fL (ref 80.0–100.0)
Monocytes Absolute: 0.7 10*3/uL (ref 0.1–1.0)
Monocytes Relative: 8 %
Neutro Abs: 6.3 10*3/uL (ref 1.7–7.7)
Neutrophils Relative %: 77 %
Platelets: 263 10*3/uL (ref 150–400)
RBC: 5.55 MIL/uL (ref 4.22–5.81)
RDW: 12.2 % (ref 11.5–15.5)
WBC: 8.1 10*3/uL (ref 4.0–10.5)
nRBC: 0 % (ref 0.0–0.2)

## 2019-03-27 LAB — MAGNESIUM: Magnesium: 2.2 mg/dL (ref 1.7–2.4)

## 2019-03-27 LAB — TROPONIN I (HIGH SENSITIVITY)
Troponin I (High Sensitivity): 19 ng/L — ABNORMAL HIGH (ref <18)
Troponin I (High Sensitivity): 21 ng/L — ABNORMAL HIGH (ref <18)

## 2019-03-27 LAB — RESPIRATORY PANEL BY RT PCR (FLU A&B, COVID)
Influenza A by PCR: NEGATIVE
Influenza B by PCR: NEGATIVE
SARS Coronavirus 2 by RT PCR: NEGATIVE

## 2019-03-27 LAB — ETHANOL: Alcohol, Ethyl (B): <10 mg/dL (ref <10)

## 2019-03-27 LAB — LACTIC ACID, PLASMA
Lactic Acid, Venous: 2.5 mmol/L (ref 0.5–1.9)
Lactic Acid, Venous: 2 mmol/L (ref 0.5–1.9)

## 2019-03-27 LAB — PROTIME-INR
INR: 1.1 (ref 0.8–1.2)
Prothrombin Time: 14.1 seconds (ref 11.4–15.2)

## 2019-03-27 LAB — HIV ANTIBODY (ROUTINE TESTING W REFLEX): HIV Screen 4th Generation wRfx: NONREACTIVE

## 2019-03-27 MED ORDER — ENOXAPARIN SODIUM 60 MG/0.6ML ~~LOC~~ SOLN
0.5000 mg/kg | SUBCUTANEOUS | Status: DC
  Administered 2019-03-27 – 2019-04-01 (×6): 60 mg via SUBCUTANEOUS
  Filled 2019-03-27 (×6): qty 0.6

## 2019-03-27 MED ORDER — ACETAMINOPHEN 650 MG RE SUPP: 650.0000 mg | Freq: Four times a day (QID) | RECTAL | Status: DC | PRN

## 2019-03-27 MED ORDER — INSULIN REGULAR(HUMAN) IN NACL 100-0.9 UT/100ML-% IV SOLN
INTRAVENOUS | Status: DC
  Administered 2019-03-27: 17:00:00 10.5 [IU]/h via INTRAVENOUS
  Filled 2019-03-27: qty 100

## 2019-03-27 MED ORDER — SODIUM CHLORIDE 0.9 % IV BOLUS
1000.0000 mL | Freq: Once | INTRAVENOUS | Status: AC
  Administered 2019-03-27: 17:00:00 1000 mL via INTRAVENOUS

## 2019-03-27 MED ORDER — POTASSIUM CHLORIDE 10 MEQ/100ML IV SOLN
10.0000 meq | INTRAVENOUS | Status: AC
  Administered 2019-03-27 (×2): 10 meq via INTRAVENOUS
  Filled 2019-03-27 (×2): qty 100

## 2019-03-27 MED ORDER — METOPROLOL TARTRATE 5 MG/5ML IV SOLN: 5.0000 mg | INTRAVENOUS | Status: DC | PRN

## 2019-03-27 MED ORDER — VANCOMYCIN HCL IN DEXTROSE 1-5 GM/200ML-% IV SOLN: 1000.0000 mg | Freq: Once | INTRAVENOUS | Status: DC

## 2019-03-27 MED ORDER — ONDANSETRON HCL 4 MG PO TABS: 4.0000 mg | ORAL_TABLET | Freq: Four times a day (QID) | ORAL | Status: DC | PRN

## 2019-03-27 MED ORDER — DEXTROSE 50 % IV SOLN: 0.0000 mL | INTRAVENOUS | Status: DC | PRN

## 2019-03-27 MED ORDER — LORAZEPAM 2 MG/ML IJ SOLN
0.5000 mg | INTRAMUSCULAR | Status: DC | PRN
  Administered 2019-03-27 – 2019-03-28 (×2): 0.5 mg via INTRAVENOUS
  Filled 2019-03-27 (×2): qty 1

## 2019-03-27 MED ORDER — INSULIN ASPART 100 UNIT/ML ~~LOC~~ SOLN
0.0000 [IU] | SUBCUTANEOUS | Status: DC
  Administered 2019-03-27: 22:00:00 5 [IU] via SUBCUTANEOUS
  Administered 2019-03-28: 3 [IU] via SUBCUTANEOUS
  Administered 2019-03-28: 5 [IU] via SUBCUTANEOUS

## 2019-03-27 MED ORDER — SODIUM CHLORIDE 0.9 % IV SOLN: INTRAVENOUS | Status: DC

## 2019-03-27 MED ORDER — SODIUM CHLORIDE 0.9 % IV BOLUS
1000.0000 mL | Freq: Once | INTRAVENOUS | Status: AC
  Administered 2019-03-27: 14:00:00 1000 mL via INTRAVENOUS

## 2019-03-27 MED ORDER — SODIUM CHLORIDE 0.9 % IV SOLN
2.0000 g | Freq: Once | INTRAVENOUS | Status: AC
  Administered 2019-03-27: 15:00:00 2 g via INTRAVENOUS
  Filled 2019-03-27: qty 2

## 2019-03-27 MED ORDER — METRONIDAZOLE IN NACL 5-0.79 MG/ML-% IV SOLN
500.0000 mg | Freq: Once | INTRAVENOUS | Status: AC
  Administered 2019-03-27: 500 mg via INTRAVENOUS
  Filled 2019-03-27: qty 100

## 2019-03-27 MED ORDER — IOHEXOL 300 MG/ML  SOLN
100.0000 mL | Freq: Once | INTRAMUSCULAR | Status: AC | PRN
  Administered 2019-03-27: 100 mL via INTRAVENOUS

## 2019-03-27 MED ORDER — POTASSIUM CHLORIDE IN NACL 20-0.45 MEQ/L-% IV SOLN
INTRAVENOUS | Status: DC
  Filled 2019-03-27 (×7): qty 1000

## 2019-03-27 MED ORDER — VANCOMYCIN HCL IN DEXTROSE 1-5 GM/200ML-% IV SOLN
1000.0000 mg | Freq: Two times a day (BID) | INTRAVENOUS | Status: DC
  Administered 2019-03-28 – 2019-04-01 (×10): 1000 mg via INTRAVENOUS
  Filled 2019-03-27 (×10): qty 200

## 2019-03-27 MED ORDER — ACETAMINOPHEN 325 MG PO TABS
650.0000 mg | ORAL_TABLET | Freq: Four times a day (QID) | ORAL | Status: DC | PRN
  Administered 2019-03-28 – 2019-04-01 (×8): 650 mg via ORAL
  Filled 2019-03-27 (×8): qty 2

## 2019-03-27 MED ORDER — TRAZODONE HCL 50 MG PO TABS
25.0000 mg | ORAL_TABLET | Freq: Every evening | ORAL | Status: DC | PRN
  Administered 2019-03-28 – 2019-03-29 (×2): 25 mg via ORAL
  Filled 2019-03-27 (×2): qty 1

## 2019-03-27 MED ORDER — METRONIDAZOLE IN NACL 5-0.79 MG/ML-% IV SOLN
500.0000 mg | Freq: Three times a day (TID) | INTRAVENOUS | Status: DC
  Administered 2019-03-27 – 2019-03-31 (×12): 500 mg via INTRAVENOUS
  Filled 2019-03-27 (×12): qty 100

## 2019-03-27 MED ORDER — VANCOMYCIN HCL 2000 MG/400ML IV SOLN
2000.0000 mg | Freq: Once | INTRAVENOUS | Status: AC
  Administered 2019-03-27: 15:00:00 2000 mg via INTRAVENOUS
  Filled 2019-03-27: qty 400

## 2019-03-27 MED ORDER — ONDANSETRON HCL 4 MG/2ML IJ SOLN: 4.0000 mg | Freq: Four times a day (QID) | INTRAMUSCULAR | Status: DC | PRN

## 2019-03-27 MED ORDER — LABETALOL HCL 5 MG/ML IV SOLN
10.0000 mg | INTRAVENOUS | Status: DC | PRN
  Administered 2019-03-27 – 2019-03-29 (×3): 10 mg via INTRAVENOUS
  Filled 2019-03-27 (×3): qty 4

## 2019-03-27 MED ORDER — CHLORHEXIDINE GLUCONATE CLOTH 2 % EX PADS
6.0000 | MEDICATED_PAD | Freq: Every day | CUTANEOUS | Status: DC
  Administered 2019-03-28 – 2019-04-02 (×4): 6 via TOPICAL

## 2019-03-27 MED ORDER — SODIUM CHLORIDE 0.9 % IV SOLN
2.0000 g | Freq: Three times a day (TID) | INTRAVENOUS | Status: DC
  Administered 2019-03-27 – 2019-03-31 (×12): 2 g via INTRAVENOUS
  Filled 2019-03-27 (×12): qty 2

## 2019-03-27 NOTE — ED Triage Notes (Addendum)
Pt brought in by EMS from [facility] after being released from mental hospital . Pt taken to this hotel due to being homeless and able to stay 3 days. Hotel called EMS pt had slid out of w/c. Continually repeating his nephews name when questioned asked. Pt has ulcer bottom of left foot and right BKA. cbg [id number]

## 2019-03-27 NOTE — Progress Notes (Signed)
Notified bedside nurse of need to administer antibiotics.  

## 2019-03-27 NOTE — H&P (Addendum)
History and Physical    Darren West ZYS:[id number] DOB: 1955-08-22 DOA: 03/27/2019  PCP: Patient, No Pcp Per   Patient coming from: Home  I have personally briefly reviewed patient's old medical records in [facility]  Chief Complaint: Altered mental status, Ulcer left foot  HPI: Darren West is a 64 y.o. male with medical history significant for diabetes mellitus, hypertension, schizophrenia current suicidal ideations, right below-knee amputation. Patient was recently discharged from a behavioural health facility 3/12-3/15 after hospitalization for suicidal ideations.  He was discharged to a hotel because he was homeless and was able to stay about 3 days.  Also called EMS because patient had slid out of w/c , and was repeating his nephew's name continuously when he was questioned. On my evaluation, patient is lethargic, answers yes or no, I am unsure if this answers are accurate.  Still confused and not able to give me details.  He denies cough or difficulty breathing, he denies chest pain, he points to his left lower abdomen when asked if he has pain.  He is unable to tell me if he has been compliant with his medications.  ED Course: Temperature 98.6.  Tachycardic to 112, blood pressure systolic 932T to 557D.  Blood glucose elevated at 733, normal anion gap 13, serum bicarb 29.  Lactic acid elevated 2.5, normal WBC 8.1.  Chest x-ray showed left lower lobe infiltrate.  X-ray of the left foot showed soft tissue swelling abnormality.  EKG showed atrial fibrillation rate 103-no prior EKGs.  Patient was started on IV vancomycin and cefepime for HCAP.  Patient was also started on insulin drip.  Review of Systems: Unable to ascertain due to patient's altered mental status.  Past Medical History:  Diagnosis Date  . Below-knee amputation of right lower extremity ([place])   . Diabetes mellitus without complication ([place])   . Frequent urination   . Hypertension   . Neuropathy   . Schizophrenia  ([place])     History reviewed. No pertinent surgical history.   reports that he has never smoked. He has never used smokeless tobacco. No history on file for alcohol and drug.  No Known Allergies  Unable to ascertain due to patient's altered mental status.  Prior to Admission medications   Medication Sig Start Date End Date Taking? Authorizing Provider  acetaminophen (TYLENOL) 500 MG tablet Take 500 mg by mouth every 6 (six) hours as needed for mild pain.    [provider]  albuterol (VENTOLIN HFA) 108 (90 Base) MCG/ACT inhaler Inhale 2 puffs into the lungs every 6 (six) hours as needed for wheezing or shortness of breath.    [provider]  baclofen (LIORESAL) 10 MG tablet Take 10 mg by mouth 3 (three) times daily as needed for muscle spasms.    [provider]  budesonide-formoterol (SYMBICORT) 160-4.5 MCG/ACT inhaler Inhale 2 puffs into the lungs 2 (two) times daily.    [provider]  calcium carbonate (TUMS - DOSED IN MG ELEMENTAL CALCIUM) 500 MG chewable tablet Chew 1 tablet by mouth 2 (two) times daily as needed for indigestion or heartburn.    [provider]  carvedilol (COREG) 25 MG tablet Take 12.5 mg by mouth every 12 (twelve) hours.    [provider]  DULoxetine (CYMBALTA) 30 MG capsule Take 30 mg by mouth daily.    [provider]  finasteride (PROSCAR) 5 MG tablet Take 5 mg by mouth daily.    [provider]  gabapentin (NEURONTIN) 300  MG capsule Take 600 mg by mouth 2 (two) times daily as needed (pain).    [provider]  insulin aspart (NOVOLOG) 100 UNIT/ML injection Inject 15 Units into the skin 3 (three) times daily before meals.    [provider]  ipratropium-albuterol (DUONEB) 0.5-2.5 (3) MG/3ML SOLN Take 3 mLs by nebulization every 4 (four) hours as needed (wheezing/SOB).    [provider]  metFORMIN (GLUCOPHAGE) 500 MG tablet Take 500 mg by mouth daily with breakfast.     [provider]  nicotine polacrilex (COMMIT) 2 MG lozenge Take 2 mg by mouth as needed for smoking cessation.    [provider]  OVER THE COUNTER MEDICATION Apply 1 application topically 4 (four) times daily as needed.    [provider]  tamsulosin (FLOMAX) 0.4 MG CAPS capsule Take 0.4 mg by mouth daily after supper.    [provider]    Physical Exam: Vitals:   03/27/19 1247 03/27/19 1305 03/27/19 1500 03/27/19 1512  BP: (!) 174/92 (!) 158/83 (!) 187/116 (!) 180/106  Pulse: (!) 112 (!) 108  (!) 110  Resp: (!) 26 (!) 23 19 (!) 22  Temp: 98.6 F (37 C)     TempSrc: Oral     SpO2: 95% 93%  94%  Weight: 120.7 kg     Height: 6\' 2"  (1.88 m)       Constitutional: Lethargic, shaking his left lower extremity continuously Vitals:   03/27/19 1247 03/27/19 1305 03/27/19 1500 03/27/19 1512  BP: (!) 174/92 (!) 158/83 (!) 187/116 (!) 180/106  Pulse: (!) 112 (!) 108  (!) 110  Resp: (!) 26 (!) 23 19 (!) 22  Temp: 98.6 F (37 C)     TempSrc: Oral     SpO2: 95% 93%  94%  Weight: 120.7 kg     Height: 6\' 2"  (1.88 m)      Eyes: Pupils equal and round but not reactive to light, lids and conjunctivae normal ENMT: Mucous membranes are dry. Neck: normal, supple, no masses, no thyromegaly Respiratory:  Normal respiratory effort. No accessory muscle use.  Cardiovascular:, Tachycardic, irregular rate and rhythm, .  Trace extremity edema left lower extremity, 2+ pedal pulses.   Abdomen: Obese abdomen, tenderness left lower quadrant, abdomen soft, no masses palpated. No hepatosplenomegaly.  Musculoskeletal: no clubbing / cyanosis.  Right below-knee amputation good ROM, no contractures.  Skin: Stage III ulcer to sole of left lower extremity, with surrounding erythema and induration, unable to appreciate tenderness-patient altered. Neurologic: Exam limited by mental status, following a few directions, able to lift bilateral upper extremity and left lower  extremity. Psychiatric: lethargic, able to tell me his name, but not oriented to place, time or situation.         Labs on Admission: I have personally reviewed following labs and imaging studies  CBC: Recent Labs  Lab 03/27/19 1410  WBC 8.1  NEUTROABS 6.3  HGB 16.3  HCT 50.2  MCV 90.5  PLT 263   Basic Metabolic Panel: Recent Labs  Lab 03/27/19 1410  NA 146*  K 3.9  CL 104  CO2 29  GLUCOSE 733*  BUN 27*  CREATININE 1.17  CALCIUM 9.9   Liver Function Tests: Recent Labs  Lab 03/27/19 1410  AST 12*  ALT 12  ALKPHOS 172*  BILITOT 0.7  PROT 8.9*  ALBUMIN 3.9   Coagulation Profile: Recent Labs  Lab 03/27/19 1410  INR 1.1   Urine analysis:    Component Value Date/Time  COLORURINE STRAW (A) 03/27/2019 1341   APPEARANCEUR CLEAR 03/27/2019 1341   LABSPEC 1.031 (H) 03/27/2019 1341   PHURINE 6.0 03/27/2019 1341   GLUCOSEU >=500 (A) 03/27/2019 1341   HGBUR SMALL (A) 03/27/2019 1341   BILIRUBINUR NEGATIVE 03/27/2019 1341   KETONESUR 5 (A) 03/27/2019 1341   PROTEINUR 100 (A) 03/27/2019 1341   NITRITE NEGATIVE 03/27/2019 1341   LEUKOCYTESUR NEGATIVE 03/27/2019 1341    Radiological Exams on Admission: DG Chest Port 1 View  Result Date: 03/27/2019 CLINICAL DATA:  Altered mental status. Nonverbal. EXAM: PORTABLE CHEST 1 VIEW COMPARISON:  03/11/2019 FINDINGS: Low lung volumes. There is patchy infiltrate at the LEFT lung base, new since the prior study. No pulmonary edema. No pleural effusions. Remote resection of the distal LEFT clavicle. IMPRESSION: New LEFT lower lobe infiltrate. Electronically Signed   By: Norva Pavlov M.D.   On: 03/27/2019 14:24   DG Foot 2 Views Left  Result Date: 03/27/2019 CLINICAL DATA:  Nonverbal. Ulcer of the bottom of the LEFT foot. RIGHT BKA. EXAM: LEFT FOOT - 2 VIEW COMPARISON:  None. FINDINGS: Significant soft tissue swelling of the forefoot. Remote segmental resection or trauma of the 5th metatarsal. No acute fracture or  subluxation. No radiopaque foreign body or soft tissue gas. Achilles calcaneal spur noted. IMPRESSION: Soft tissue swelling. No evidence for acute osseous abnormality. Electronically Signed   By: Norva Pavlov M.D.   On: 03/27/2019 14:26    EKG: Independently reviewed.  Atrial fibrillation, rate 103.  No prior EKGs to compare.  But EKG from care everywhere 3/10 reports is sinus rhythm with first-degree AV block with premature atrial complexes(unable to view actual EKG tracing).  Assessment/Plan Active Problems:   Hyperglycemia   Uncontrolled diabetes mellitus (HCC)   Essential hypertension   S/P BKA (below knee amputation) unilateral, right (HCC)   HCAP (healthcare-associated pneumonia)   Diabetic ulcer of left foot ([facility])    Hyperglycemia in uncontrolled diabetic- blood sugar 733, with normal anion gap of 13 and serum bicarb of 29.  Likely due to noncompliance.  Patient also living in hotel.  Home medications include Metformin, and NovoLog with meals .  Recent hemoglobin A1c per Care Everywhere 3/12 -  11.3. -Continue insulin drip, switch to SS-I q4h when glucose < 250. -1 L bolus given, add additional 1 L bolus -Continue with 1/2 n/s + 20 KCL ( Sodium trending up). -Monitor electrolytes -Hold home Metformin  New onset atrial fibrillation-in the setting of hyperglycemia and HCAP.  Per care everywhere- Recent normal TSH 3/13 - low normal at 0.5 with free T4 1.03. CHADs2Vasc score at least 2 for DM, HTN.  -Check magnesium, troponin -Obtain echocardiogram - PRN IV metoprolol 5mg  for heart rate greater than 110. -At this time will hold off on anticoagulation, considering history of noncompliance, history of attempted suicide, and episode of atrial fibrillation likely provoked by hyperglycemia and HCAP  Healthcare associated pneumonia- rules in for sepsis with tachycardia, tachypnea, with altered mental status and lactic acidosis of 2.5.  On room air, sats > 93%. But a degree of dehydration  also likely contributing to lactic acidosis.  Respiratory virus panel negative for COVID-19 and influenza. -Follow-up blood and urine cultures -Continue IV Vanco and cefepime -BMP, CBC a.m.  Metabolic encephalopathy-likely due to hyperglycemia, HCAP and dehydration.  Head CT unremarkable.  Left lower quadrant pain and tenderness, ?  Duration.  Afebrile without leukocytosis.  Lactic acid 2.5. -Obtain abdominal CT with contrast-bilateral nonobstructing renal calculi otherwise unremarkable. -Continue IV antibiotics -  NPO till improvement in mental status -Hold home psychoactive for now, gabapentin, Cymbalta.  Diabetic foot ulcer with cellulitis- left foot x-ray shows soft tissue swelling, no evidence of acute osseous abnormality.  Right BKA. -Continue broad-spectrum antibiotics IV vancomycin, cefepime and metronidazole -Follow-up cultures -Wound care consult  Hypertension-elevated. -Hold home Coreg 12.5 mg every 12 hourly - PRN labetalol for systolic >  170  Hx of suicide ideation , schizophrenia-Per care everywhere, patient reported a history of at least one prior suicide gesture of cutting his wrist years ago.  Recent hospitalization at behavioral health facility for suicidal ideations. -Hold home Cymbalta. -Suicide precautions  Right below-knee amputation   BPH -Hold home tamsulosin, finasteride while n.p.o.   DVT prophylaxis: Lovenox Code Status: Full code Family Communication: None at bedside Disposition Plan:  ~ 2 days, pending improvement in blood sugars, and mental status. Consults called: None Admission status: Inpatient, stepdown I certify that at the point of admission it is my clinical judgment that the patient will require inpatient hospital care spanning beyond 2 midnights from the point of admission due to high intensity of service, high risk for further deterioration and high frequency of surveillance required. The following factors support the patient status of  inpatient: Requiring IV insulin.   Onnie Boer MD [facility]  03/27/2019, 4:40 PM

## 2019-03-27 NOTE — Progress Notes (Signed)
Patient removed external catheter. Peri care provided and sheets changed.

## 2019-03-27 NOTE — Progress Notes (Signed)
Patient continues to toss from side to side in bed. 22 gauge iv in right hand was pulled out during this. Pressure applied with gauze and patient hand cleaned. Prn ativan given, see emar.

## 2019-03-27 NOTE — Progress Notes (Signed)
Notified bedside nurse of need to draw repeat lactic acid. 

## 2019-03-27 NOTE — Progress Notes (Signed)
Pharmacy Antibiotic Note  Darren West is a 64 y.o. male admitted on 03/27/2019 with sepsis.  Pharmacy has been consulted for cefepime and vancomycin dosing.  Plan: Vancomycin 1000mg  IV every 12 hours.  Goal trough 15-20 mcg/mL. cefepime 2gm iv q8h   Height: 6\' 2"  (188 cm) Weight: 266 lb 3.2 oz (120.7 kg) IBW/kg (Calculated) : 82.2  Temp (24hrs), Avg:98.6 F (37 C), Min:98.6 F (37 C), Max:98.6 F (37 C)  No results for input(s): WBC, CREATININE, LATICACIDVEN, VANCOTROUGH, VANCOPEAK, VANCORANDOM, GENTTROUGH, GENTPEAK, GENTRANDOM, TOBRATROUGH, TOBRAPEAK, TOBRARND, AMIKACINPEAK, AMIKACINTROU, AMIKACIN in the last 168 hours.  Estimated Creatinine Clearance: 82.4 mL/min (A) (by C-G formula based on SCr of 1.25 mg/dL (H)).    No Known Allergies  Antimicrobials this admission: 3/20 vancomycin >>  3/20 cefepime >>  3/20 metronidazole x  1  Microbiology results: 3/20 BCx: sent 3/20 UCx: sent    Thank you for allowing pharmacy to be a part of this patient's care.  4/20 Darren West 03/27/2019 1:49 PM

## 2019-03-27 NOTE — Progress Notes (Signed)
Insulin gtt decreased to 6.5u/hr per endotool. Will recheck blood sugar in 1 hour.

## 2019-03-27 NOTE — ED Notes (Signed)
Lab attempting to draw second set of blood cultures.

## 2019-03-27 NOTE — Progress Notes (Signed)
Insulin gtt stopped per order. Blood Sugar is 211.

## 2019-03-27 NOTE — ED Provider Notes (Signed)
[facility] EMERGENCY DEPARTMENT Provider Note   CSN: [id number] Arrival date & time: 03/27/19  1235     History Chief Complaint  Patient presents with  . Fall  . Altered Mental Status    Darren West is a 64 y.o. male.  [facility] from hotel [facility].  Where he was placed for as being homeless following discharge from behavioral health.  Was only able to stay there for 3 days.  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  Patient reportedly fell but also had altered mental status.  Patient does present confused.  Has redness to his left foot, concern for infection there.        Past Medical History:  Diagnosis Date  . Below-knee amputation of right lower extremity (HCC)   . Diabetes mellitus without complication (HCC)   . Frequent urination   . Hypertension   . Neuropathy   . Schizophrenia [facility])     Patient Active Problem List   Diagnosis Date Noted  . Hyperglycemia 03/27/2019  . Homelessness 03/12/2019    History reviewed. No pertinent surgical history.     No family history on file.  Social History   Tobacco Use  . Smoking status: Never Smoker  . Smokeless tobacco: Never Used  Substance Use Topics  . Alcohol use: Not on file  . Drug use: Not on file    Home Medications Prior to Admission medications   Medication Sig Start Date End Date Taking? Authorizing Provider  acetaminophen (TYLENOL) 500 MG tablet Take 500 mg by mouth every 6 (six) hours as needed for mild pain.    [provider]  albuterol (VENTOLIN HFA) 108 (90 Base) MCG/ACT inhaler Inhale 2 puffs into the lungs every 6 (six) hours as needed for wheezing or shortness of breath.    [provider]  baclofen (LIORESAL) 10 MG tablet Take 10 mg by mouth 3 (three) times daily as needed for muscle spasms.    [provider]  budesonide-formoterol (SYMBICORT) 160-4.5 MCG/ACT inhaler Inhale 2 puffs into  the lungs 2 (two) times daily.    [provider]  calcium carbonate (TUMS - DOSED IN MG ELEMENTAL CALCIUM) 500 MG chewable tablet Chew 1 tablet by mouth 2 (two) times daily as needed for indigestion or heartburn.    [provider]  carvedilol (COREG) 25 MG tablet Take 12.5 mg by mouth every 12 (twelve) hours.    [provider]  DULoxetine (CYMBALTA) 30 MG capsule Take 30 mg by mouth daily.    [provider]  finasteride (PROSCAR) 5 MG tablet Take 5 mg by mouth daily.    [provider]  gabapentin (NEURONTIN) 300 MG capsule Take 600 mg by mouth 2 (two) times daily as needed (pain).    [provider]  insulin aspart (NOVOLOG) 100 UNIT/ML injection Inject 15 Units into the skin 3 (three) times daily before meals.    [provider]  ipratropium-albuterol (DUONEB) 0.5-2.5 (3) MG/3ML SOLN Take 3 mLs by nebulization every 4 (four) hours as needed (wheezing/SOB).    [provider]  metFORMIN (GLUCOPHAGE) 500 MG tablet Take 500 mg by mouth daily with breakfast.    [provider]  nicotine polacrilex (COMMIT) 2 MG lozenge Take 2 mg by mouth as needed for smoking cessation.    [provider]  OVER THE COUNTER MEDICATION Apply 1 application topically 4 (four) times daily as needed.  [provider]  tamsulosin (FLOMAX) 0.4 MG CAPS capsule Take 0.4 mg by mouth daily after supper.    [provider]    Allergies    Patient has no known allergies.  Review of Systems   Review of Systems  Unable to perform ROS: Mental status change    Physical Exam Updated Vital Signs BP (!) 175/102   Pulse (!) 112   Temp 98.3 F (36.8 C) (Oral)   Resp (!) 26   Ht 1.88 m (6\' 2" )   Wt 120.7 kg   SpO2 94%   BMI 34.18 kg/m   Physical Exam Vitals and nursing note reviewed.  Constitutional:      Appearance: Normal appearance. He is well-developed.  HENT:     Head: Normocephalic and atraumatic.   Eyes:     Extraocular Movements: Extraocular movements intact.     Conjunctiva/sclera: Conjunctivae normal.     Pupils: Pupils are equal, round, and reactive to light.  Cardiovascular:     Rate and Rhythm: Normal rate and regular rhythm.     Heart sounds: No murmur.  Pulmonary:     Effort: Pulmonary effort is normal. No respiratory distress.     Breath sounds: Normal breath sounds.  Abdominal:     Palpations: Abdomen is soft.     Tenderness: There is no abdominal tenderness.  Musculoskeletal:        General: Tenderness present. Normal range of motion.     Cervical back: Normal range of motion and neck supple.     Comments: Large ulcer to the sole of the left foot.  With some swelling to the foot and erythema.  No crepitance.  Some tenderness to palpation.  The right leg is has a BKA.  Skin:    General: Skin is warm and dry.     Capillary Refill: Capillary refill takes less than 2 seconds.  Neurological:     General: No focal deficit present.     Mental Status: He is alert and oriented to person, place, and time.     ED Results / Procedures / Treatments   Labs (all labs ordered are listed, but only abnormal results are displayed) Labs Reviewed  COMPREHENSIVE METABOLIC PANEL - Abnormal; Notable for the following components:      Result Value   Sodium 146 (*)    Glucose, Bld 733 (*)    BUN 27 (*)    Total Protein 8.9 (*)    AST 12 (*)    Alkaline Phosphatase 172 (*)    All other components within normal limits  LACTIC ACID, PLASMA - Abnormal; Notable for the following components:   Lactic Acid, Venous 2.5 (*)    All other components within normal limits  URINALYSIS, ROUTINE W REFLEX MICROSCOPIC - Abnormal; Notable for the following components:   Color, Urine STRAW (*)    Specific Gravity, Urine 1.031 (*)    Glucose, UA >=500 (*)    Hgb urine dipstick SMALL (*)    Ketones, ur 5 (*)    Protein, ur 100 (*)    Bacteria, UA RARE (*)    All other components within normal  limits  CULTURE, BLOOD (ROUTINE X 2)  CULTURE, BLOOD (ROUTINE X 2)  URINE CULTURE  RESPIRATORY PANEL BY RT PCR (FLU A&B, COVID)  CBC WITH DIFFERENTIAL/PLATELET  APTT  PROTIME-INR  RAPID URINE DRUG SCREEN, HOSP PERFORMED  ETHANOL  MAGNESIUM  LACTIC ACID, PLASMA    EKG EKG Interpretation  Date/Time:  Saturday March 27 2019 13:47:35 EDT Ventricular Rate:  103 PR Interval:    QRS Duration: 95 QT Interval:  340 QTC Calculation: 445 R Axis:   100 Text Interpretation: Atrial fibrillation Right axis deviation Borderline repolarization abnormality No previous ECGs available Confirmed by Vanetta Mulders [phone]) on 03/27/2019 3:00:52 PM   Radiology CT Head Wo Contrast  Result Date: 03/27/2019 CLINICAL DATA:  Altered mental status EXAM: CT HEAD WITHOUT CONTRAST TECHNIQUE: Contiguous axial images were obtained from the base of the skull through the vertex without intravenous contrast. COMPARISON:  None. FINDINGS: Brain: No evidence of acute infarction, hemorrhage, hydrocephalus, extra-axial collection or mass lesion/mass effect. Vascular: Atherosclerotic calcifications involving the large vessels of the skull base. No unexpected hyperdense vessel. Skull: Normal. Negative for fracture or focal lesion. Sinuses/Orbits: No acute finding. Other: Within the subcutaneous soft tissues of the posterior right neck is a rounded low attenuation structure measuring 1.8 x 1.6 x 1.8 cm (series 3, image 34; series 7, image 66). IMPRESSION: 1. No acute intracranial pathology. 2. Rounded low attenuation structure in the subcutaneous soft tissues of the posterior right neck measuring 1.8 cm. This may represent a sebaceous cyst or other cystic lesion. Correlate with physical exam. Electronically Signed   By: Duanne Guess D.O.   On: 03/27/2019 15:59   DG Chest Port 1 View  Result Date: 03/27/2019 CLINICAL DATA:  Altered mental status. Nonverbal. EXAM: PORTABLE CHEST 1 VIEW COMPARISON:  03/11/2019 FINDINGS: Low  lung volumes. There is patchy infiltrate at the LEFT lung base, new since the prior study. No pulmonary edema. No pleural effusions. Remote resection of the distal LEFT clavicle. IMPRESSION: New LEFT lower lobe infiltrate. Electronically Signed   By: Norva Pavlov M.D.   On: 03/27/2019 14:24   DG Foot 2 Views Left  Result Date: 03/27/2019 CLINICAL DATA:  Nonverbal. Ulcer of the bottom of the LEFT foot. RIGHT BKA. EXAM: LEFT FOOT - 2 VIEW COMPARISON:  None. FINDINGS: Significant soft tissue swelling of the forefoot. Remote segmental resection or trauma of the 5th metatarsal. No acute fracture or subluxation. No radiopaque foreign body or soft tissue gas. Achilles calcaneal spur noted. IMPRESSION: Soft tissue swelling. No evidence for acute osseous abnormality. Electronically Signed   By: Norva Pavlov M.D.   On: 03/27/2019 14:26    Procedures Procedures (including critical care time)  CRITICAL CARE Performed by: Vanetta Mulders Total critical care time: 40 minutes Critical care time was exclusive of separately billable procedures and treating other patients. Critical care was necessary to treat or prevent imminent or life-threatening deterioration. Critical care was time spent personally by me on the following activities: development of treatment plan with patient and/or surrogate as well as nursing, discussions with consultants, evaluation of patient's response to treatment, examination of patient, obtaining history from patient or surrogate, ordering and performing treatments and interventions, ordering and review of laboratory studies, ordering and review of radiographic studies, pulse oximetry and re-evaluation of patient's condition.   Medications Ordered in ED Medications  metroNIDAZOLE (FLAGYL) IVPB 500 mg (500 mg Intravenous New Bag/Given 03/27/19 1516)  0.9 %  sodium chloride infusion (has no administration in time range)  vancomycin (VANCOREADY) IVPB 2000 mg/400 mL (2,000 mg  Intravenous New Bag/Given 03/27/19 1518)  ceFEPIme (MAXIPIME) 2 g in sodium chloride 0.9 % 100 mL IVPB (has no administration in time range)  vancomycin (VANCOCIN) IVPB 1000 mg/200 mL premix (has no administration in time range)  insulin regular, human (MYXREDLIN) 100 units/ 100 mL infusion (has no administration in time  range)  dextrose 50 % solution 0-50 mL (has no administration in time range)  ceFEPIme (MAXIPIME) 2 g in sodium chloride 0.9 % 100 mL IVPB (0 g Intravenous Stopped 03/27/19 1512)  sodium chloride 0.9 % bolus 1,000 mL (1,000 mLs Intravenous New Bag/Given 03/27/19 1429)    ED Course  I have reviewed the triage vital signs and the nursing notes.  Pertinent labs & imaging results that were available during my care of the patient were reviewed by me and considered in my medical decision making (see chart for details).    MDM Rules/Calculators/A&P                     Patient with some altered mental status.  Also concerns for infection.  Sepsis order set ordered.  But patient not meeting true sepsis criteria.  Patient received broad-spectrum antibiotics for that.  X-ray of the left foot where there seems to be some cellulitis showed no bony abnormalities or any gas in the soft tissues.  Chest x-ray showed a left lower lobe pneumonia.  Broad-spectrum antibiotics will cover that.  Patient's lactic acid is elevated.  But less than 4.  Patient's blood sugar is very high in 700 range but not acidotic.  Started on insulin drip for this.  Endo tool order set used.  Head CT is pending.  Discussed with hospitalist they will admit.  Covid testing is pending.  Blood cultures have been completed.     Final Clinical Impression(s) / ED Diagnoses Final diagnoses:  HCAP (healthcare-associated pneumonia)  Cellulitis of foot, left  Atrial fibrillation with RVR ([place])  Hyperglycemia    Rx / DC Orders ED Discharge Orders    None       Fredia Sorrow, MD 03/27/19 1611

## 2019-03-27 NOTE — Progress Notes (Addendum)
Increased Insulin gtt to 7u/hr per endotool.   Will stop insulin gtt when sugar less than 250 per Dr. Mariea Clonts

## 2019-03-27 NOTE — Progress Notes (Signed)
Patient denies suicidal thoughts. Patient is disoriented to situation only, follows commands. Patient does appear anxious and continues to roll back and forth in bed. Does not use call light even after repeated instruction, instead he calls out to nurse from bed.

## 2019-03-27 NOTE — ED Notes (Signed)
Date and time results received: 03/27/19 1514 (use smartphrase ".now" to insert current time)  Test: lactic acid 2.5 Critical Value: glucose 733  Name of Provider Notified: [email or web address]  Orders Received? Or Actions Taken?:

## 2019-03-28 ENCOUNTER — Inpatient Hospital Stay (HOSPITAL_COMMUNITY): Payer: Medicare (Managed Care)

## 2019-03-28 ENCOUNTER — Encounter (HOSPITAL_COMMUNITY): Payer: Self-pay

## 2019-03-28 ENCOUNTER — Inpatient Hospital Stay: Payer: Self-pay

## 2019-03-28 DIAGNOSIS — I4891 Unspecified atrial fibrillation: Secondary | ICD-10-CM

## 2019-03-28 DIAGNOSIS — E1165 Type 2 diabetes mellitus with hyperglycemia: Secondary | ICD-10-CM

## 2019-03-28 DIAGNOSIS — E11621 Type 2 diabetes mellitus with foot ulcer: Secondary | ICD-10-CM

## 2019-03-28 DIAGNOSIS — L97429 Non-pressure chronic ulcer of left heel and midfoot with unspecified severity: Secondary | ICD-10-CM

## 2019-03-28 DIAGNOSIS — I10 Essential (primary) hypertension: Secondary | ICD-10-CM

## 2019-03-28 DIAGNOSIS — Z89511 Acquired absence of right leg below knee: Secondary | ICD-10-CM

## 2019-03-28 LAB — GLUCOSE, CAPILLARY
Glucose-Capillary: 193 mg/dL — ABNORMAL HIGH (ref 70–99)
Glucose-Capillary: 217 mg/dL — ABNORMAL HIGH (ref 70–99)
Glucose-Capillary: 231 mg/dL — ABNORMAL HIGH (ref 70–99)
Glucose-Capillary: 244 mg/dL — ABNORMAL HIGH (ref 70–99)
Glucose-Capillary: 252 mg/dL — ABNORMAL HIGH (ref 70–99)
Glucose-Capillary: 273 mg/dL — ABNORMAL HIGH (ref 70–99)
Glucose-Capillary: 425 mg/dL — ABNORMAL HIGH (ref 70–99)

## 2019-03-28 LAB — CBC
HCT: 40.6 % (ref 39.0–52.0)
Hemoglobin: 13.2 g/dL (ref 13.0–17.0)
MCH: 29 pg (ref 26.0–34.0)
MCHC: 32.5 g/dL (ref 30.0–36.0)
MCV: 89.2 fL (ref 80.0–100.0)
Platelets: 203 10*3/uL (ref 150–400)
RBC: 4.55 MIL/uL (ref 4.22–5.81)
RDW: 12.2 % (ref 11.5–15.5)
WBC: 8.3 10*3/uL (ref 4.0–10.5)
nRBC: 0 % (ref 0.0–0.2)

## 2019-03-28 LAB — ECHOCARDIOGRAM COMPLETE
Height: 74 in
Weight: 4243.41 oz

## 2019-03-28 LAB — BASIC METABOLIC PANEL
Anion gap: 7 (ref 5–15)
BUN: 18 mg/dL (ref 8–23)
CO2: 26 mmol/L (ref 22–32)
Calcium: 8.3 mg/dL — ABNORMAL LOW (ref 8.9–10.3)
Chloride: 114 mmol/L — ABNORMAL HIGH (ref 98–111)
Creatinine, Ser: 0.9 mg/dL (ref 0.61–1.24)
GFR calc Af Amer: >60 mL/min (ref >60)
GFR calc non Af Amer: >60 mL/min (ref >60)
Glucose, Bld: 212 mg/dL — ABNORMAL HIGH (ref 70–99)
Potassium: 3.4 mmol/L — ABNORMAL LOW (ref 3.5–5.1)
Sodium: 147 mmol/L — ABNORMAL HIGH (ref 135–145)

## 2019-03-28 LAB — HEMOGLOBIN A1C
Hgb A1c MFr Bld: 12 % — ABNORMAL HIGH (ref 4.8–5.6)
Mean Plasma Glucose: 297.7 mg/dL

## 2019-03-28 LAB — MRSA PCR SCREENING: MRSA by PCR: NEGATIVE

## 2019-03-28 MED ORDER — FINASTERIDE 5 MG PO TABS
5.0000 mg | ORAL_TABLET | Freq: Every day | ORAL | Status: DC
  Administered 2019-03-28 – 2019-04-02 (×6): 5 mg via ORAL
  Filled 2019-03-28 (×6): qty 1

## 2019-03-28 MED ORDER — INSULIN ASPART 100 UNIT/ML ~~LOC~~ SOLN
0.0000 [IU] | Freq: Three times a day (TID) | SUBCUTANEOUS | Status: DC
  Administered 2019-03-28: 16:00:00 8 [IU] via SUBCUTANEOUS
  Administered 2019-03-28: 5 [IU] via SUBCUTANEOUS
  Administered 2019-03-29: 11 [IU] via SUBCUTANEOUS
  Administered 2019-03-29: 08:00:00 5 [IU] via SUBCUTANEOUS
  Administered 2019-03-29: 17:00:00 8 [IU] via SUBCUTANEOUS
  Administered 2019-03-30: 5 [IU] via SUBCUTANEOUS
  Administered 2019-03-30: 11 [IU] via SUBCUTANEOUS
  Administered 2019-03-30 – 2019-03-31 (×2): 5 [IU] via SUBCUTANEOUS
  Administered 2019-03-31: 8 [IU] via SUBCUTANEOUS
  Administered 2019-03-31: 5 [IU] via SUBCUTANEOUS
  Administered 2019-04-01: 8 [IU] via SUBCUTANEOUS
  Administered 2019-04-01: 3 [IU] via SUBCUTANEOUS
  Administered 2019-04-02: 5 [IU] via SUBCUTANEOUS
  Administered 2019-04-02: 15 [IU] via SUBCUTANEOUS

## 2019-03-28 MED ORDER — INSULIN DETEMIR 100 UNIT/ML ~~LOC~~ SOLN
12.0000 [IU] | Freq: Two times a day (BID) | SUBCUTANEOUS | Status: DC
  Administered 2019-03-28: 22:00:00 12 [IU] via SUBCUTANEOUS
  Filled 2019-03-28 (×2): qty 0.12
  Filled 2019-03-28: qty 1

## 2019-03-28 MED ORDER — INSULIN ASPART 100 UNIT/ML ~~LOC~~ SOLN
0.0000 [IU] | Freq: Every day | SUBCUTANEOUS | Status: DC
  Administered 2019-03-28 – 2019-03-29 (×2): 3 [IU] via SUBCUTANEOUS
  Administered 2019-03-30: 2 [IU] via SUBCUTANEOUS
  Administered 2019-03-31: 22:00:00 5 [IU] via SUBCUTANEOUS
  Administered 2019-04-01: 4 [IU] via SUBCUTANEOUS

## 2019-03-28 MED ORDER — DULOXETINE HCL 30 MG PO CPEP
30.0000 mg | ORAL_CAPSULE | Freq: Every day | ORAL | Status: DC
  Administered 2019-03-28 – 2019-04-02 (×6): 30 mg via ORAL
  Filled 2019-03-28 (×6): qty 1

## 2019-03-28 MED ORDER — TAMSULOSIN HCL 0.4 MG PO CAPS
0.4000 mg | ORAL_CAPSULE | Freq: Every day | ORAL | Status: DC
  Administered 2019-03-28 – 2019-04-01 (×5): 0.4 mg via ORAL
  Filled 2019-03-28 (×6): qty 1

## 2019-03-28 MED ORDER — LORAZEPAM 2 MG/ML IJ SOLN
0.5000 mg | Freq: Three times a day (TID) | INTRAMUSCULAR | Status: DC | PRN
  Administered 2019-03-29: 0.5 mg via INTRAVENOUS
  Filled 2019-03-28 (×2): qty 1

## 2019-03-28 MED ORDER — INSULIN ASPART 100 UNIT/ML ~~LOC~~ SOLN
20.0000 [IU] | Freq: Once | SUBCUTANEOUS | Status: AC
  Administered 2019-03-28: 12:00:00 20 [IU] via SUBCUTANEOUS

## 2019-03-28 MED ORDER — INSULIN DETEMIR 100 UNIT/ML ~~LOC~~ SOLN
12.0000 [IU] | Freq: Every day | SUBCUTANEOUS | Status: DC
  Administered 2019-03-28: 09:00:00 12 [IU] via SUBCUTANEOUS
  Filled 2019-03-28 (×2): qty 0.12

## 2019-03-28 MED ORDER — CARVEDILOL 12.5 MG PO TABS
12.5000 mg | ORAL_TABLET | Freq: Two times a day (BID) | ORAL | Status: DC
  Administered 2019-03-28 – 2019-04-02 (×11): 12.5 mg via ORAL
  Filled 2019-03-28 (×11): qty 1

## 2019-03-28 MED ORDER — COLLAGENASE 250 UNIT/GM EX OINT
TOPICAL_OINTMENT | Freq: Every day | CUTANEOUS | Status: DC
  Filled 2019-03-28: qty 30

## 2019-03-28 NOTE — Progress Notes (Signed)
Patient is alert and oriented to person and place at this time. He has made multiple phone calls to the [facility] asking if he was able to return. The hotel staff hangs up on him when he asks them questions. Currently patient is on the phone with friends/family asking them to come pick him up and help him out that he "needs to go get an ID". Patient knows he is at [facility], in the ICU, and he knows that EMS picked him up from the [facility]. He did not know that today was Sunday. Patient has not stated anything about trying to hurt himself or others. Patient has been cooperative with staff providing care. He keeps asking for multiple cups of water. He will occasionally take his cardiac monitoring leads off but is willing to let staff put the leads back on.

## 2019-03-28 NOTE — Progress Notes (Signed)
PROGRESS NOTE    Darren West  TOI:[id number] DOB: 16-Feb-1955 DOA: 03/27/2019 PCP: Patient, No Pcp Per     Brief Narrative:  As per H&P written by Dr. Denton Brick on 03/27/2019  64 y.o. male with medical history significant for diabetes mellitus, hypertension, schizophrenia current suicidal ideations, right below-knee amputation. Patient was recently discharged from a behavioural health facility 3/12-3/15 after hospitalization for suicidal ideations.  He was discharged to a hotel because he was homeless and was able to stay about 3 days.  Also called EMS because patient had slid out of w/c , and was repeating his nephew's name continuously when he was questioned. On my evaluation, patient is lethargic, answers yes or no, I am unsure if this answers are accurate.  Still confused and not able to give me details.  He denies cough or difficulty breathing, he denies chest pain, he points to his left lower abdomen when asked if he has pain.  He is unable to tell me if he has been compliant with his medications.  ED Course: Temperature 98.6.  Tachycardic to 112, blood pressure systolic 833A to 250N.  Blood glucose elevated at 733, normal anion gap 13, serum bicarb 29.  Lactic acid elevated 2.5, normal WBC 8.1.  Chest x-ray showed left lower lobe infiltrate.  X-ray of the left foot showed soft tissue swelling abnormality.  EKG showed atrial fibrillation rate 103-no prior EKGs.  Patient was started on IV vancomycin and cefepime for HCAP.  Patient was also started on insulin drip.   Assessment & Plan: 1-honk: Uncontrolled type 2 diabetes with hyperglycemia  -elevated A1c of 11.3 demonstrating poor control at baseline. -Continue sliding scale insulin, modified carbohydrate diet and initiation of long-acting twice daily -Follow CBGs and adjust regimen as needed. -Continue holding oral hypoglycemic agents while inpatient. -Medication noncompliance is a decrease factor for patient uncontrolled  sugar.  2-transient atrial fibrillation in the setting of hyperglycemia, HCAP and rebound from absent baseline beta-blocker usage. -Continue metoprolol -Patient back to sinus rhythm -Continue monitoring on telemetry -Normal TSH appreciated -No long term anticoagulation anticipated secondary to poor compliance and transient event.  3-HCAP/left lower extremity cellulitis From diabetic ulcer. -Follow wound care services recommendations -Continue current antibiotics -Patient is afebrile and with normal WBC; sepsis features present on admission essentially resolved. -Follow clinical response.  4-elevated blood pressure -Resume home beta-blocker, Flomax and Proscar  5-BPH -Resume Flomax and Proscar -Patient reports no urinary retention symptoms.  6-status post right BKA -No open wounds appreciated on his stump -Continue to provide assistance and supportive care as needed.  7-class I obesity -Body mass index is 34.05 kg/m. -Low calorie diet, portion control and increase physical activity discussed with patient.    DVT prophylaxis: Lovenox Code Status: Full code Family Communication: No family at bedside. Disposition Plan: Remains in the hospital; continue adjusting sugar levels, follow cultures results and continue current IV antibiotics.  Follow wound care service recommendations and discussed with social worker for assistance/safe transition at time of Discharge to further provide his care.  Consultants:   Wound care service  Procedures:   See below for x-ray reports.  Antimicrobials:  Anti-infectives (From admission, onward)   Start     Dose/Rate Route Frequency Ordered Stop   03/28/19 0200  vancomycin (VANCOCIN) IVPB 1000 mg/200 mL premix     1,000 mg 200 mL/hr over 60 Minutes Intravenous Every 12 hours 03/27/19 1348     03/27/19 2300  metroNIDAZOLE (FLAGYL) IVPB 500 mg     500 mg  100 mL/hr over 60 Minutes Intravenous Every 8 hours 03/27/19 1639     03/27/19 2000   ceFEPIme (MAXIPIME) 2 g in sodium chloride 0.9 % 100 mL IVPB     2 g 200 mL/hr over 30 Minutes Intravenous Every 8 hours 03/27/19 1348     03/27/19 1346  vancomycin (VANCOREADY) IVPB 2000 mg/400 mL     2,000 mg 200 mL/hr over 120 Minutes Intravenous  Once 03/27/19 1346 03/27/19 1718   03/27/19 1345  ceFEPIme (MAXIPIME) 2 g in sodium chloride 0.9 % 100 mL IVPB     2 g 200 mL/hr over 30 Minutes Intravenous  Once 03/27/19 1341 03/27/19 1512   03/27/19 1345  metroNIDAZOLE (FLAGYL) IVPB 500 mg     500 mg 100 mL/hr over 60 Minutes Intravenous  Once 03/27/19 1341 03/27/19 1621   03/27/19 1345  vancomycin (VANCOCIN) IVPB 1000 mg/200 mL premix  Status:  Discontinued     1,000 mg 200 mL/hr over 60 Minutes Intravenous  Once 03/27/19 1341 03/27/19 1346       Subjective: No fever, reports no chest pain no palpitations currently.  Patient denies suicidal ideation, nausea, vomiting or any other complaints.  Expressed some neuropathy.  Objective: Vitals:   03/28/19 0706 03/28/19 0730 03/28/19 0744 03/28/19 0745  BP: (!) 166/90     Pulse: 92 89  91  Resp:  (!) 21  (!) 23  Temp:   97.9 F (36.6 C)   TempSrc:   Axillary   SpO2: 96% 94%  94%  Weight:      Height:        Intake/Output Summary (Last 24 hours) at 03/28/2019 0846 Last data filed at 03/28/2019 0814 Gross per 24 hour  Intake 4686.98 ml  Output 1200 ml  Net 3486.98 ml   Filed Weights   03/27/19 1247 03/27/19 1700 03/28/19 0630  Weight: 120.7 kg 117.8 kg 120.3 kg    Examination: General exam: Somnolent, oriented x2; unknown baseline mental capacity.  Afebrile, no nausea, no vomiting, no chest pain, no abdominal pain. Respiratory system: Clear to auscultation. Respiratory effort normal. Cardiovascular system:RRR. No murmurs, rubs, gallops. Gastrointestinal system: Abdomen is nondistended, soft and nontender. No organomegaly or masses felt. Normal bowel sounds heard. Central nervous system: Alert and oriented. No focal  neurological deficits. Extremities/skin: Left lower extremity cellulitic changes and positive ulcer in the bronchus fluid; no active drainage or odor appreciated.  Trace edema seen.  Right BKA. Psychiatry: Mood & affect appropriate.     Data Reviewed: I have personally reviewed following labs and imaging studies  CBC: Recent Labs  Lab 03/27/19 1410 03/28/19 0318  WBC 8.1 8.3  NEUTROABS 6.3  --   HGB 16.3 13.2  HCT 50.2 40.6  MCV 90.5 89.2  PLT 263 203   Basic Metabolic Panel: Recent Labs  Lab 03/27/19 1410 03/27/19 1910 03/28/19 0318  NA 146* 149* 147*  K 3.9 3.3* 3.4*  CL 104 112* 114*  CO2 29 26 26   GLUCOSE 733* 459* 212*  BUN 27* 23 18  CREATININE 1.17 1.10 0.90  CALCIUM 9.9 8.9 8.3*  MG 2.2  --   --    GFR: Estimated Creatinine Clearance: 114.2 mL/min (by C-G formula based on SCr of 0.9 mg/dL).   Liver Function Tests: Recent Labs  Lab 03/27/19 1410  AST 12*  ALT 12  ALKPHOS 172*  BILITOT 0.7  PROT 8.9*  ALBUMIN 3.9   Coagulation Profile: Recent Labs  Lab 03/27/19 1410  INR 1.1  CBG: Recent Labs  Lab 03/27/19 2142 03/27/19 2218 03/28/19 0004 03/28/19 0356 03/28/19 0742  GLUCAP 260* 211* 193* 231* 217*   Urine analysis:    Component Value Date/Time   COLORURINE STRAW (A) 03/27/2019 1341   APPEARANCEUR CLEAR 03/27/2019 1341   LABSPEC 1.031 (H) 03/27/2019 1341   PHURINE 6.0 03/27/2019 1341   GLUCOSEU >=500 (A) 03/27/2019 1341   HGBUR SMALL (A) 03/27/2019 1341   BILIRUBINUR NEGATIVE 03/27/2019 1341   KETONESUR 5 (A) 03/27/2019 1341   PROTEINUR 100 (A) 03/27/2019 1341   NITRITE NEGATIVE 03/27/2019 1341   LEUKOCYTESUR NEGATIVE 03/27/2019 1341    Recent Results (from the past 240 hour(s))  Blood Culture (routine x 2)     Status: None (Preliminary result)   Collection Time: 03/27/19  1:46 PM   Specimen: BLOOD RIGHT HAND  Result Value Ref Range Status   Specimen Description   Final    BLOOD RIGHT HAND BOTTLES DRAWN AEROBIC AND  ANAEROBIC   Special Requests   Final    Blood Culture results may not be optimal due to an inadequate volume of blood received in culture bottles   Culture   Final    NO GROWTH < 24 HOURS Performed at [facility], [address], [place], [place] [address]    Report Status PENDING  Incomplete  Blood Culture (routine x 2)     Status: None (Preliminary result)   Collection Time: 03/27/19  2:23 PM   Specimen: BLOOD RIGHT WRIST  Result Value Ref Range Status   Specimen Description   Final    BLOOD RIGHT WRIST BOTTLES DRAWN AEROBIC AND ANAEROBIC   Special Requests   Final    Blood Culture results may not be optimal due to an inadequate volume of blood received in culture bottles   Culture   Final    NO GROWTH < 24 HOURS Performed at [facility], [address] [place], [place] [address]    Report Status PENDING  Incomplete  Respiratory Panel by RT PCR (Flu A&B, Covid) - Nasopharyngeal Swab     Status: None   Collection Time: 03/27/19  3:15 PM   Specimen: Nasopharyngeal Swab  Result Value Ref Range Status   SARS Coronavirus 2 by RT PCR NEGATIVE NEGATIVE Final    Comment: (NOTE) SARS-CoV-2 target nucleic acids are NOT DETECTED. The SARS-CoV-2 RNA is generally detectable in upper respiratoy specimens during the acute phase of infection. The lowest concentration of SARS-CoV-2 viral copies this assay can detect is 131 copies/mL. A negative result does not preclude SARS-Cov-2 infection and should not be used as the sole basis for treatment or other patient management decisions. A negative result may occur with  improper specimen collection/handling, submission of specimen other than nasopharyngeal swab, presence of viral mutation(s) within the areas targeted by this assay, and inadequate number of viral copies (<131 copies/mL). A negative result must be combined with clinical observations, patient history, and epidemiological information. The expected result is Negative. Fact Sheet for  Patients:  https://www.moore.com/ Fact Sheet for Healthcare Providers:  https://www.young.biz/ This test is not yet ap proved or cleared by the [place] FDA and  has been authorized for detection and/or diagnosis of SARS-CoV-2 by FDA under an Emergency Use Authorization (EUA). This EUA will remain  in effect (meaning this test can be used) for the duration of the COVID-19 declaration under Section 564(b)(1) of the Act, 21 U.S.C. section 360bbb-3(b)(1), unless the authorization is terminated or revoked sooner.    Influenza A  by PCR NEGATIVE NEGATIVE Final   Influenza B by PCR NEGATIVE NEGATIVE Final    Comment: (NOTE) The Xpert Xpress SARS-CoV-2/FLU/RSV assay is intended as an aid in  the diagnosis of influenza from Nasopharyngeal swab specimens and  should not be used as a sole basis for treatment. Nasal washings and  aspirates are unacceptable for Xpert Xpress SARS-CoV-2/FLU/RSV  testing. Fact Sheet for Patients: https://www.moore.com/ Fact Sheet for Healthcare Providers: https://www.young.biz/ This test is not yet approved or cleared by the [place] FDA and  has been authorized for detection and/or diagnosis of SARS-CoV-2 by  FDA under an Emergency Use Authorization (EUA). This EUA will remain  in effect (meaning this test can be used) for the duration of the  Covid-19 declaration under Section 564(b)(1) of the Act, 21  U.S.C. section 360bbb-3(b)(1), unless the authorization is  terminated or revoked. Performed at [facility], [address], [place], [place] [address]   MRSA PCR Screening     Status: None   Collection Time: 03/27/19  4:57 PM   Specimen: Nasal Mucosa; Nasopharyngeal  Result Value Ref Range Status   MRSA by PCR NEGATIVE NEGATIVE Final    Comment:        The GeneXpert MRSA Assay (FDA approved for NASAL specimens only), is one component of a comprehensive MRSA  colonization surveillance program. It is not intended to diagnose MRSA infection nor to guide or monitor treatment for MRSA infections. Performed at [facility], [address], [place], [place] [address]      Radiology Studies: CT Head Wo Contrast  Result Date: 03/27/2019 CLINICAL DATA:  Altered mental status EXAM: CT HEAD WITHOUT CONTRAST TECHNIQUE: Contiguous axial images were obtained from the base of the skull through the vertex without intravenous contrast. COMPARISON:  None. FINDINGS: Brain: No evidence of acute infarction, hemorrhage, hydrocephalus, extra-axial collection or mass lesion/mass effect. Vascular: Atherosclerotic calcifications involving the large vessels of the skull base. No unexpected hyperdense vessel. Skull: Normal. Negative for fracture or focal lesion. Sinuses/Orbits: No acute finding. Other: Within the subcutaneous soft tissues of the posterior right neck is a rounded low attenuation structure measuring 1.8 x 1.6 x 1.8 cm (series 3, image 34; series 7, image 66). IMPRESSION: 1. No acute intracranial pathology. 2. Rounded low attenuation structure in the subcutaneous soft tissues of the posterior right neck measuring 1.8 cm. This may represent a sebaceous cyst or other cystic lesion. Correlate with physical exam. Electronically Signed   By: Duanne Guess D.O.   On: 03/27/2019 15:59   CT ABDOMEN PELVIS W CONTRAST  Result Date: 03/27/2019 CLINICAL DATA:  Abdominal pain. EXAM: CT ABDOMEN AND PELVIS WITH CONTRAST TECHNIQUE: Multidetector CT imaging of the abdomen and pelvis was performed using the standard protocol following bolus administration of intravenous contrast. CONTRAST:  OMNIPAQUE IOHEXOL 300 MG/ML  SOLN COMPARISON:  None. FINDINGS: Lower chest: Mild bibasilar scarring and/or atelectasis is seen. Hepatobiliary: No focal liver abnormality is seen. No gallstones, gallbladder wall thickening, or biliary dilatation. Pancreas: Unremarkable. No pancreatic ductal  dilatation or surrounding inflammatory changes. Spleen: Normal in size without focal abnormality. Adrenals/Urinary Tract: Adrenal glands are unremarkable. Kidneys are normal in size. A 4.7 cm cyst is seen along the lateral aspect of the mid right kidney. 3.5 cm, 3.8 cm and 4.3 cm cysts are seen within the left kidney. A 6 mm nonobstructing renal stone is seen within the anterior aspect of the mid right kidney. A 5 mm nonobstructing renal stone is seen within the lower pole of  the left kidney. There is no evidence of hydronephrosis or renal obstruction. Bladder is unremarkable. Stomach/Bowel: A 3.4 cm x 2.8 cm gastric diverticulum is seen along the posteromedial aspect of the body of the stomach. Appendix appears normal. No evidence of bowel wall thickening, distention, or inflammatory changes. Noninflamed diverticula are seen within the large bowel. Vascular/Lymphatic: No significant vascular findings are present. No enlarged abdominal or pelvic lymph nodes. Reproductive: Prostate is unremarkable. Other: No abdominal wall hernia or abnormality. No abdominopelvic ascites. Musculoskeletal: Multilevel degenerative changes seen within the lumbar spine. IMPRESSION: 1. Bilateral nonobstructing renal calculi. 2. Noninflamed gastric and colonic diverticula. 3. Multiple bilateral renal cysts. Electronically Signed   By: Aram Candelahaddeus  Houston M.D.   On: 03/27/2019 18:09   DG Chest Port 1 View  Result Date: 03/27/2019 CLINICAL DATA:  Altered mental status. Nonverbal. EXAM: PORTABLE CHEST 1 VIEW COMPARISON:  03/11/2019 FINDINGS: Low lung volumes. There is patchy infiltrate at the LEFT lung base, new since the prior study. No pulmonary edema. No pleural effusions. Remote resection of the distal LEFT clavicle. IMPRESSION: New LEFT lower lobe infiltrate. Electronically Signed   By: Norva PavlovElizabeth  Brown M.D.   On: 03/27/2019 14:24   DG Foot 2 Views Left  Result Date: 03/27/2019 CLINICAL DATA:  Nonverbal. Ulcer of the bottom of the  LEFT foot. RIGHT BKA. EXAM: LEFT FOOT - 2 VIEW COMPARISON:  None. FINDINGS: Significant soft tissue swelling of the forefoot. Remote segmental resection or trauma of the 5th metatarsal. No acute fracture or subluxation. No radiopaque foreign body or soft tissue gas. Achilles calcaneal spur noted. IMPRESSION: Soft tissue swelling. No evidence for acute osseous abnormality. Electronically Signed   By: Norva PavlovElizabeth  Brown M.D.   On: 03/27/2019 14:26    Scheduled Meds: . carvedilol  12.5 mg Oral Q12H  . Chlorhexidine Gluconate Cloth  6 each Topical Q0600  . enoxaparin (LOVENOX) injection  0.5 mg/kg Subcutaneous Q24H  . finasteride  5 mg Oral Daily  . insulin aspart  0-15 Units Subcutaneous TID WC  . insulin aspart  0-5 Units Subcutaneous QHS  . insulin detemir  12 Units Subcutaneous Daily  . tamsulosin  0.4 mg Oral QPC supper   Continuous Infusions: . 0.45 % NaCl with KCl 20 mEq / L Stopped (03/28/19 0810)  . ceFEPime (MAXIPIME) IV Stopped (03/28/19 0629)  . metronidazole 100 mL/hr at 03/28/19 0814  . vancomycin Stopped (03/28/19 0306)     LOS: 1 day    Time spent: 35 minutes. Greater than 50% of this time was spent in direct contact with the patient, coordinating care and discussing relevant ongoing clinical issues, including extensive discussion about uncontrolled diabetes, left lower extremity with diabetic foot ulcer and the need for insulin in order to manage blood sugars.  All questions were answer to patient's satisfaction.  We will continue current antibiotics, follow wound care recommendations and if needed involve surgical service for local debridement.     Vassie Lollarlos Sacha Radloff, MD [facility] Pager [phone]   03/28/2019, 8:46 AM

## 2019-03-28 NOTE — Progress Notes (Signed)
*  PRELIMINARY RESULTS* Echocardiogram 2D Echocardiogram has been performed.  Stacey Drain 03/28/2019, 12:41 PM

## 2019-03-28 NOTE — Progress Notes (Addendum)
Patient appears to have periods of apnea,  oxygen level dropped to 84%, placed patient on 2L nasal cannula, oxygen level now 98%.

## 2019-03-28 NOTE — Consult Note (Addendum)
WOC Nurse Consult Note: Reason for Consult: Full thickness wound to left foot, plantar forefoot at 4th metatarsal head Wound type: neuropathic, infectious Pressure Injury POA: N/A Measurement: 4cm x 3cm with depth unable to be determined due to the presence of a nonviable wound bed. Wound bed: see photo in the patient's EMR from yesterday. Drainage (amount, consistency, odor) small amount of light brown exudate consistent with the necrotic wound bed. Periwound: erythematous, edematous, warm Dressing procedure/placement/frequency: I have implemented a POC that includes an enzymatic debriding agent (collagenase) as the wound bed is obscured by the presence of necrotic tissue.    Recommend Vascular or General Surgery consult to further work up and determine limb salvageability. If you agree, please order/arrange.  WOC nursing team will not follow, but will remain available to this patient, the nursing and medical teams.  Please re-consult if needed. Thanks, Ladona Mow, MSN, RN, GNP, [facility]  Pager# [phone]

## 2019-03-28 NOTE — Plan of Care (Signed)
  Problem: Acute Rehab PT Goals(only PT should resolve) Goal: Pt Will Go Supine/Side To Sit Outcome: Progressing Flowsheets (Taken 03/28/2019 1347) Pt will go Supine/Side to Sit: with modified independence Goal: Pt Will Go Sit To Supine/Side Outcome: Progressing Flowsheets (Taken 03/28/2019 1347) Pt will go Sit to Supine/Side: with modified independence Goal: Patient Will Transfer Sit To/From Stand Outcome: Progressing Flowsheets (Taken 03/28/2019 1347) Patient will transfer sit to/from stand:  with +2  from elevated surface  with moderate assist Goal: Pt Will Transfer Bed To Chair/Chair To Bed Outcome: Progressing Flowsheets (Taken 03/28/2019 1347) Pt will Transfer Bed to Chair/Chair to Bed:  with +2  with mod assist   Britta Mccreedy D. Hartnett-Rands, MS, PT Per Diem PT [facility] [phone] 03/28/2019

## 2019-03-28 NOTE — Progress Notes (Signed)
Bladder scanned patient 211 ML shown, abdomen remains soft. Patient reports not having to pee at this time, abdomen remains soft.

## 2019-03-28 NOTE — Progress Notes (Addendum)
This RN, another ICU RN, the SWOT RN, and AP AC has attempted to get IV access on patient. Patient currently has no IV access for fluids, IV abx, and PRN IV medications. MD notified.   Order placed for patient to get a Midline. [facility] was called. They stated if they had an opening then they would come tonight to place the midline. If not, then they would cancel the order because [facility]'s IV team would be available in the morning to place patient's midline.

## 2019-03-28 NOTE — Progress Notes (Signed)
NT spoke to Production designer, theatre/television/film, Sam, at [employer]. The manager stated that the patient's belongings are at the hotel and can be picked up when patient is discharged or can be picked up by someone else prior to patient being discharged from hospital. The manager did not specify exactly what belongings were at the hotel. Patient was made aware that his belongings were at the hotel.

## 2019-03-28 NOTE — Progress Notes (Signed)
Patient does not show or verbalize any suicidal ideations. Remains alert and oriented with confusion at times. Follows commands, has been nonconfrontational with nurses. Bed alarm on, bell in reach.

## 2019-03-28 NOTE — Evaluation (Signed)
Physical Therapy Evaluation Patient Details Name: Darren West MRN: [id number] DOB: 26-May-1955 Today's Date: 03/28/2019   History of Present Illness  Darren West is a 64 y.o. male. Patient brought in from hotel shelter.  Where he was placed for as being homeless following discharge from behavioral health.  Was only able to stay there for 3 days.  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  Patient reportedly fell but also had altered mental status.  Patient does present confused.  Has redness to his left foot, concern for infection there.    Clinical Impression  Pt admitted with above diagnosis. Patient difficult to arouse initially but agreeable to participating in PT evaluation today. When asked about his DME (RW, wheelchair, right BKA prosthesis), patient inconsistent about where these items were. Nursing staff state he came to hospital without any belongings or clothes. Patient able to complete bed mobility modified independent but does use bedrails for moving up in bed and sidelying to sit. Patient refused any weight bearing mobility out of the bed without his right BKA prosthesis. He reported prior to admission he would don his prosthesis before attempting transfers out of bed or wheelchair. History of falling. Patient is 6'2" and 265 lbs. For safety, 2 PT staff may be warranted for mobility attempts out of bed. Pt currently with functional limitations due to the deficits listed below (see PT Problem List). Pt will benefit from skilled PT to increase their independence and safety with mobility to allow discharge to the venue listed below.      Follow Up Recommendations SNF;Supervision/Assistance - 24 hour    Equipment Recommendations  None recommended by PT    Recommendations for Other Services       Precautions / Restrictions Precautions Precautions: Fall Precaution Comments: Rt BKA, left plantar foot wound; per patient  left rotator cuff tear. Restrictions Weight Bearing Restrictions: No      Mobility  Bed Mobility Overal bed mobility: Modified Independent             General bed mobility comments: increased time; use of bedrail  Transfers Overall transfer level: Needs assistance Equipment used: Rolling walker (2 wheeled) Transfers: Sit to/from Stand           General transfer comment: patient refused to attempt without his right BKA prosthesis; location of patient's prosthesis, RW and wheelchair are unknown at this time  Ambulation/Gait             General Gait Details: patient refused to attempt without his right BKA prosthesis; location of patient's prosthesis, RW and wheelchair are unknown at this time.  Stairs            Wheelchair Mobility    Modified Rankin (Stroke Patients Only)       Balance                                             Pertinent Vitals/Pain Pain Assessment: 0-10 Pain Score: 8  Pain Location: left shoulder Pain Descriptors / Indicators: Sharp;Shooting Pain Intervention(s): Limited activity within patient's tolerance;Monitored during session    Home Living Family/patient expects to be discharged to:: Unsure Living Arrangements: Alone                    Prior Function Level of Independence: Needs assistance   Gait /  Transfers Assistance Needed: Rt BKA; wheelchair bound; has prosthesis and RW but they are at a friends house as he is homeless currently; reports he uses his right prosthesis and RW for transfers to/from wheelchair  ADL's / Homemaking Assistance Needed: independent for IADLs; no car; used microwave for meals        Hand Dominance   Dominant Hand: Right    Extremity/Trunk Assessment   Upper Extremity Assessment Upper Extremity Assessment: LUE deficits/detail LUE Deficits / Details: minimal movement of left shoulder; left at side for functional mobility in bed    Lower Extremity  Assessment Lower Extremity Assessment: RLE deficits/detail;Generalized weakness;LLE deficits/detail RLE Deficits / Details: right BKA LLE Sensation: history of peripheral neuropathy    Cervical / Trunk Assessment Cervical / Trunk Assessment: Normal  Communication   Communication: No difficulties  Cognition Arousal/Alertness: Lethargic(constant movement of legs and right arm.) Behavior During Therapy: WFL for tasks assessed/performed Overall Cognitive Status: Within Functional Limits for tasks assessed                                        General Comments      Exercises     Assessment/Plan    PT Assessment Patient needs continued PT services  PT Problem List Decreased strength;Decreased mobility;Decreased activity tolerance;Decreased balance;Pain;Decreased cognition;Decreased skin integrity       PT Treatment Interventions DME instruction;Therapeutic activities;Gait training;Therapeutic exercise;Patient/family education;Balance training;Wheelchair mobility training;Functional mobility training    PT Goals (Current goals can be found in the Care Plan section)  Acute Rehab PT Goals Patient Stated Goal: Locate his belongings. PT Goal Formulation: With patient Time For Goal Achievement: 04/11/19 Potential to Achieve Goals: Fair    Frequency Min 3X/week   Barriers to discharge        Co-evaluation               AM-PAC PT "6 Clicks" Mobility  Outcome Measure Help needed turning from your back to your side while in a flat bed without using bedrails?: None Help needed moving from lying on your back to sitting on the side of a flat bed without using bedrails?: A Little Help needed moving to and from a bed to a chair (including a wheelchair)?: A Lot Help needed standing up from a chair using your arms (e.g., wheelchair or bedside chair)?: A Lot Help needed to walk in hospital room?: A Lot Help needed climbing 3-5 steps with a railing? : Total 6 Click  Score: 14    End of Session   Activity Tolerance: Patient limited by lethargy;Treatment limited secondary to agitation(patient refused mobility out of bed without his right BKA prosthesis.) Patient left: in bed;with nursing/sitter in room;with call bell/phone within reach Nurse Communication: Mobility status PT Visit Diagnosis: Unsteadiness on feet (R26.81);History of falling (Z91.81);Muscle weakness (generalized) (M62.81);Difficulty in walking, not elsewhere classified (R26.2);Other abnormalities of gait and mobility (R26.89);Pain Pain - Right/Left: Left Pain - part of body: Shoulder    Time: 1250-1330 PT Time Calculation (min) (ACUTE ONLY): 40 min   Charges:   PT Evaluation $PT Eval Moderate Complexity: 1 Mod PT Treatments $Therapeutic Activity: 8-22 mins $Self Care/Home Management: 8-22        Floria Raveling. Hartnett-Rands, MS, PT [facility] [place] #[id number] 03/28/2019, 1:41 PM

## 2019-03-29 ENCOUNTER — Inpatient Hospital Stay (HOSPITAL_COMMUNITY): Payer: Medicare (Managed Care)

## 2019-03-29 DIAGNOSIS — L03116 Cellulitis of left lower limb: Secondary | ICD-10-CM

## 2019-03-29 DIAGNOSIS — E13621 Other specified diabetes mellitus with foot ulcer: Secondary | ICD-10-CM

## 2019-03-29 DIAGNOSIS — L97529 Non-pressure chronic ulcer of other part of left foot with unspecified severity: Secondary | ICD-10-CM

## 2019-03-29 LAB — GLUCOSE, CAPILLARY
Glucose-Capillary: 290 mg/dL — ABNORMAL HIGH (ref 70–99)
Glucose-Capillary: 232 mg/dL — ABNORMAL HIGH (ref 70–99)
Glucose-Capillary: 327 mg/dL — ABNORMAL HIGH (ref 70–99)
Glucose-Capillary: 282 mg/dL — ABNORMAL HIGH (ref 70–99)
Glucose-Capillary: 266 mg/dL — ABNORMAL HIGH (ref 70–99)

## 2019-03-29 LAB — URINE CULTURE

## 2019-03-29 MED ORDER — GABAPENTIN 100 MG PO CAPS
200.0000 mg | ORAL_CAPSULE | Freq: Three times a day (TID) | ORAL | Status: DC
  Administered 2019-03-29 – 2019-04-02 (×12): 200 mg via ORAL
  Filled 2019-03-29 (×13): qty 2

## 2019-03-29 MED ORDER — HYDROXYZINE HCL 10 MG PO TABS
10.0000 mg | ORAL_TABLET | Freq: Three times a day (TID) | ORAL | Status: DC | PRN
  Administered 2019-03-29 – 2019-04-01 (×3): 10 mg via ORAL
  Filled 2019-03-29 (×3): qty 1

## 2019-03-29 MED ORDER — POTASSIUM CHLORIDE CRYS ER 20 MEQ PO TBCR
40.0000 meq | EXTENDED_RELEASE_TABLET | Freq: Once | ORAL | Status: AC
  Administered 2019-03-29: 15:00:00 40 meq via ORAL
  Filled 2019-03-29: qty 2

## 2019-03-29 MED ORDER — INSULIN ASPART 100 UNIT/ML ~~LOC~~ SOLN
5.0000 [IU] | Freq: Three times a day (TID) | SUBCUTANEOUS | Status: DC
  Administered 2019-03-29 – 2019-03-30 (×3): 5 [IU] via SUBCUTANEOUS

## 2019-03-29 MED ORDER — POTASSIUM CHLORIDE IN NACL 20-0.45 MEQ/L-% IV SOLN
INTRAVENOUS | Status: AC
  Filled 2019-03-29: qty 1000

## 2019-03-29 MED ORDER — INSULIN DETEMIR 100 UNIT/ML ~~LOC~~ SOLN
15.0000 [IU] | Freq: Two times a day (BID) | SUBCUTANEOUS | Status: DC
  Administered 2019-03-29 (×2): 15 [IU] via SUBCUTANEOUS
  Filled 2019-03-29 (×5): qty 0.15

## 2019-03-29 MED ORDER — BACLOFEN 10 MG PO TABS
10.0000 mg | ORAL_TABLET | Freq: Three times a day (TID) | ORAL | Status: DC | PRN
  Administered 2019-03-29 – 2019-04-01 (×5): 10 mg via ORAL
  Filled 2019-03-29 (×5): qty 1

## 2019-03-29 NOTE — Progress Notes (Signed)
Inpatient Diabetes Program Recommendations  AACE/ADA: New Consensus Statement on Inpatient Glycemic Control (2015)  Target Ranges:  Prepandial:   less than 140 mg/dL      Peak postprandial:   less than 180 mg/dL (1-2 hours)      Critically ill patients:  140 - 180 mg/dL   Lab Results  Component Value Date   GLUCAP 232 (H) 03/29/2019   HGBA1C 12.0 (H) 03/28/2019    Review of Glycemic Control Results for Darren West, Darren West (MRN [id number]) as of 03/29/2019 10:09  Ref. Range 03/28/2019 16:17 03/28/2019 20:05 03/28/2019 23:45 03/29/2019 03:01 03/29/2019 07:35  Glucose-Capillary Latest Ref Range: 70 - 99 mg/dL 480 (H) 165 (H) 537 (H) 290 (H) 232 (H)   Diabetes history: DM2 Outpatient Diabetes medications: Lantus 20 units + Novolog 6 units tid meal coverage (D/C'd from [facility] ED on 03/28/19 with D/C prescriptions) Current orders for Inpatient glycemic control: Levemir 15 units bid + Novolog moderate correction tid + hs 0-5 units  Inpatient Diabetes Program Recommendations:   -Add Novolog 6 units tid meal coverage if eats 50% meals  Thank you, Billy Fischer. Sommer Spickard, RN, MSN, CDE  Diabetes Coordinator Inpatient Glycemic Control Team Team Pager [phone] (8am-5pm) 03/29/2019 10:12 AM
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
## 2019-03-29 NOTE — NC FL2 (Signed)
[place] LEVEL OF CARE SCREENING TOOL     IDENTIFICATION  Patient Name: Darren West Birthdate: 05-06-1955 Sex: male Admission Date (Current Location): 03/27/2019  [facility] and [place] Number:  [employer] and Address:  [facility] [address] [place]      Provider Number: [id number]  Attending Physician Name and Address:  Barton Dubois, MD  Relative Name and Phone Number:       Current Level of Care: Hospital Recommended Level of Care: [place] Prior Approval Number:    Date Approved/Denied:   PASRR Number:    Discharge Plan: SNF    Current Diagnoses: Patient Active Problem List   Diagnosis Date Noted  . Hyperglycemia 03/27/2019  . Uncontrolled diabetes mellitus ([place]) 03/27/2019  . Essential hypertension 03/27/2019  . S/P BKA (below knee amputation) unilateral, right ([place]) 03/27/2019  . HCAP (healthcare-associated pneumonia) 03/27/2019  . Diabetic ulcer of left foot ([place]) 03/27/2019  . Homelessness 03/12/2019    Orientation RESPIRATION BLADDER Height & Weight     Self, Time, Situation, Place  Normal Continent Weight: 265 lb 3.4 oz (120.3 kg) Height:  6\' 2"  (188 cm)  BEHAVIORAL SYMPTOMS/MOOD NEUROLOGICAL BOWEL NUTRITION STATUS      Continent Diet(heart healthy/carb modified)  AMBULATORY STATUS COMMUNICATION OF NEEDS Skin   (patient is right BKA has prosthesis) Verbally Other (Comment)(diabetic ulcer left foot left posterior ball of foot)                       Personal Care Assistance Level of Assistance  Bathing, Feeding, Dressing Bathing Assistance: Limited assistance Feeding assistance: Independent Dressing Assistance: Limited assistance     Functional Limitations Info  Sight, Hearing, Speech Sight Info: Adequate Hearing Info: Adequate Speech Info: Adequate    SPECIAL CARE FACTORS FREQUENCY  PT (By licensed PT)     PT Frequency: 5x/week              Contractures  Contractures Info: Not present    Additional Factors Info  Code Status, Allergies, Psychotropic Code Status Info: full code Allergies Info: nka Psychotropic Info: cymbalta         Current Medications (03/29/2019):  This is the current hospital active medication list Current Facility-Administered Medications  Medication Dose Route Frequency Provider Last Rate Last Admin  . 0.45 % NaCl with KCl 20 mEq / L infusion   Intravenous Continuous Barton Dubois, MD      . acetaminophen (TYLENOL) tablet 650 mg  650 mg Oral Q6H PRN Barton Dubois, MD   650 mg at 03/29/19 1500   Or  . acetaminophen (TYLENOL) suppository 650 mg  650 mg Rectal Q6H PRN Barton Dubois, MD      . baclofen (LIORESAL) tablet 10 mg  10 mg Oral Q8H PRN Barton Dubois, MD   10 mg at 03/29/19 1439  . carvedilol (COREG) tablet 12.5 mg  12.5 mg Oral Q12H Barton Dubois, MD   12.5 mg at 03/29/19 0900  . ceFEPIme (MAXIPIME) 2 g in sodium chloride 0.9 % 100 mL IVPB  2 g Intravenous Q8H Barton Dubois, MD   Stopped at 03/29/19 1334  . Chlorhexidine Gluconate Cloth 2 % PADS 6 each  6 each Topical Q0600 Barton Dubois, MD   6 each at 03/29/19 0654  . collagenase (SANTYL) ointment   Topical Daily Barton Dubois, MD   Given at 03/29/19 0900  . DULoxetine (CYMBALTA) DR capsule 30 mg  30 mg Oral Daily  Vassie Loll, MD   30 mg at 03/29/19 0900  . enoxaparin (LOVENOX) injection 60 mg  0.5 mg/kg Subcutaneous Q24H Vassie Loll, MD   60 mg at 03/28/19 1620  . finasteride (PROSCAR) tablet 5 mg  5 mg Oral Daily Vassie Loll, MD   5 mg at 03/29/19 0900  . gabapentin (NEURONTIN) capsule 200 mg  200 mg Oral TID Vassie Loll, MD      . insulin aspart (novoLOG) injection 0-15 Units  0-15 Units Subcutaneous TID WC Vassie Loll, MD   11 Units at 03/29/19 1111  . insulin aspart (novoLOG) injection 0-5 Units  0-5 Units Subcutaneous QHS Vassie Loll, MD   3 Units at 03/28/19 2134  . insulin aspart (novoLOG) injection 5 Units  5 Units  Subcutaneous TID WC Vassie Loll, MD      . insulin detemir (LEVEMIR) injection 15 Units  15 Units Subcutaneous BID Vassie Loll, MD   15 Units at 03/29/19 0900  . labetalol (NORMODYNE) injection 10 mg  10 mg Intravenous Q2H PRN Vassie Loll, MD   10 mg at 03/27/19 1720  . LORazepam (ATIVAN) injection 0.5 mg  0.5 mg Intravenous Q8H PRN Vassie Loll, MD   0.5 mg at 03/29/19 0015  . metroNIDAZOLE (FLAGYL) IVPB 500 mg  500 mg Intravenous Myriam Jacobson, MD 100 mL/hr at 03/29/19 1505 500 mg at 03/29/19 1505  . ondansetron (ZOFRAN) tablet 4 mg  4 mg Oral Q6H PRN Vassie Loll, MD       Or  . ondansetron [facility]) injection 4 mg  4 mg Intravenous Q6H PRN Vassie Loll, MD      . tamsulosin (FLOMAX) capsule 0.4 mg  0.4 mg Oral QPC supper Vassie Loll, MD   0.4 mg at 03/28/19 1700  . traZODone (DESYREL) tablet 25 mg  25 mg Oral QHS PRN Vassie Loll, MD   25 mg at 03/28/19 0036  . vancomycin (VANCOCIN) IVPB 1000 mg/200 mL premix  1,000 mg Intravenous Q12H Vassie Loll, MD 200 mL/hr at 03/29/19 1351 1,000 mg at 03/29/19 1351     Discharge Medications: Please see discharge summary for a list of discharge medications.  Relevant Imaging Results:  Relevant Lab Results:   Additional Information SSN [id number]. Patient's insurance is [employer].  Zienna Ahlin, Juleen China, LCSW

## 2019-03-29 NOTE — Consult Note (Signed)
Reason for Consult: Left lower extremity diabetic ulcer Referring Physician: Dr. Kathreen Cornfield Darren West is an 64 y.o. male with a past medical history significant for diabetes mellitus, schizophrenia with suicidal ideation, hypertension, and right below-knee amputation who presents to the hospital with a left lower extremity diabetic ulcer. The patient states that the ulcer began a month ago and is uncertain about the ulcers progression. He states that the ulcer is not painful. Patient cannot feel sensation in toes, but has palpable left posterior tibial and femoral pulses. He states that he had a similar episode in 2012 in his right lower extremity that was complicated by osteomyelitis and subsequently below the knee amputation.  Past Medical History:  Diagnosis Date  . Below-knee amputation of right lower extremity (HCC)   . Diabetes mellitus without complication (HCC)   . Frequent urination   . Hypertension   . Neuropathy   . Schizophrenia (HCC)     History reviewed. No pertinent surgical history.  History reviewed. No pertinent family history.  Social History:  Patient reports that he traveled to [facility] from [place], [place] 2 weeks ago in an attempt to find affordable housing. He states that he receives his healthcare and medications from the [place]. He does not currently have a PCP in [place]. Reports that he has never smoked. He has never used smokeless tobacco. No history on file for alcohol and drug.  Allergies: No Known Allergies  Medications: I have reviewed the patient's current medications.  Results for orders placed or performed during the hospital encounter of 03/27/19 (from the past 48 hour(s))  Comprehensive metabolic panel     Status: Abnormal   Collection Time: 03/27/19  2:10 PM  Result Value Ref Range   Sodium 146 (H) 135 - 145 mmol/L   Potassium 3.9 3.5 - 5.1 mmol/L   Chloride 104 98 - 111 mmol/L   CO2 29 22 - 32 mmol/L   Glucose, Bld 733 (HH) 70 - 99 mg/dL    Comment: Glucose  reference range applies only to samples taken after fasting for at least 8 hours. CRITICAL RESULT CALLED TO, READ BACK BY AND VERIFIED WITH: GENTRY,R. AT 1512 ON 03/27/2019 BY EVA    BUN 27 (H) 8 - 23 mg/dL   Creatinine, Ser 1.61 0.61 - 1.24 mg/dL   Calcium 9.9 8.9 - 09.6 mg/dL   Total Protein 8.9 (H) 6.5 - 8.1 g/dL   Albumin 3.9 3.5 - 5.0 g/dL   AST 12 (L) 15 - 41 U/L   ALT 12 0 - 44 U/L   Alkaline Phosphatase 172 (H) 38 - 126 U/L   Total Bilirubin 0.7 0.3 - 1.2 mg/dL   GFR calc non Af Amer >60 >60 mL/min   GFR calc Af Amer >60 >60 mL/min   Anion gap 13 5 - 15    Comment: Performed at [facility], [address], [place], [place] [address]  Lactic acid, plasma     Status: Abnormal   Collection Time: 03/27/19  2:10 PM  Result Value Ref Range   Lactic Acid, Venous 2.5 (HH) 0.5 - 1.9 mmol/L    Comment: CRITICAL RESULT CALLED TO, READ BACK BY AND VERIFIED WITH: GENTRY,R. AT 1511 ON 03/27/2019 BY EVA Performed at [facility], [address], [place], [place] [address]   CBC WITH DIFFERENTIAL     Status: None   Collection Time: 03/27/19  2:10 PM  Result Value Ref Range   WBC 8.1 4.0 - 10.5 K/uL   RBC  5.55 4.22 - 5.81 MIL/uL   Hemoglobin 16.3 13.0 - 17.0 g/dL   HCT 16.1 09.6 - 04.5 %   MCV 90.5 80.0 - 100.0 fL   MCH 29.4 26.0 - 34.0 pg   MCHC 32.5 30.0 - 36.0 g/dL   RDW 40.9 81.1 - 91.4 %   Platelets 263 150 - 400 K/uL   nRBC 0.0 0.0 - 0.2 %   Neutrophils Relative % 77 %   Neutro Abs 6.3 1.7 - 7.7 K/uL   Lymphocytes Relative 13 %   Lymphs Abs 1.1 0.7 - 4.0 K/uL   Monocytes Relative 8 %   Monocytes Absolute 0.7 0.1 - 1.0 K/uL   Eosinophils Relative 0 %   Eosinophils Absolute 0.0 0.0 - 0.5 K/uL   Basophils Relative 1 %   Basophils Absolute 0.0 0.0 - 0.1 K/uL   Immature Granulocytes 1 %   Abs Immature Granulocytes 0.05 0.00 - 0.07 K/uL    Comment: Performed at [facility], [address], [place], [place] [address]  APTT     Status: None   Collection Time: 03/27/19  2:10 PM   Result Value Ref Range   aPTT 29 24 - 36 seconds    Comment: Performed at [facility], [address] [place], [place] [address]  Protime-INR     Status: None   Collection Time: 03/27/19  2:10 PM  Result Value Ref Range   Prothrombin Time 14.1 11.4 - 15.2 seconds   INR 1.1 0.8 - 1.2    Comment: (NOTE) INR goal varies based on device and disease states. Performed at [facility], [address], [place], [place] [address]   Ethanol     Status: None   Collection Time: 03/27/19  2:10 PM  Result Value Ref Range   Alcohol, Ethyl (B) <10 <10 mg/dL    Comment: (NOTE) Lowest detectable limit for serum alcohol is 10 mg/dL. For medical purposes only. Performed at [facility], [address], [place], [place] [address]   Magnesium     Status: None   Collection Time: 03/27/19  2:10 PM  Result Value Ref Range   Magnesium 2.2 1.7 - 2.4 mg/dL    Comment: Performed at [facility], [address], [place], [place] [address]  HIV Antibody (routine testing w rflx)     Status: None   Collection Time: 03/27/19  2:22 PM  Result Value Ref Range   HIV Screen 4th Generation wRfx NON REACTIVE NON REACTIVE    Comment: Performed at [facility], [address], [place], [place] [address]  Blood Culture (routine x 2)     Status: None (Preliminary result)   Collection Time: 03/27/19  2:23 PM   Specimen: BLOOD RIGHT WRIST  Result Value Ref Range   Specimen Description      BLOOD RIGHT WRIST BOTTLES DRAWN AEROBIC AND ANAEROBIC   Special Requests      Blood Culture results may not be optimal due to an inadequate volume of blood received in culture bottles   Culture      NO GROWTH 2 DAYS Performed at [facility], [address], [place], [place] [address]    Report Status PENDING   Rapid urine drug screen (hospital performed)     Status: None   Collection Time: 03/27/19  3:14 PM  Result Value Ref Range   Opiates NONE DETECTED NONE DETECTED   Cocaine NONE DETECTED NONE DETECTED    Benzodiazepines NONE DETECTED NONE DETECTED   Amphetamines NONE  DETECTED NONE DETECTED   Tetrahydrocannabinol NONE DETECTED NONE DETECTED   Barbiturates NONE DETECTED NONE DETECTED    Comment: (NOTE) DRUG SCREEN FOR MEDICAL PURPOSES ONLY.  IF CONFIRMATION IS NEEDED FOR ANY PURPOSE, NOTIFY LAB WITHIN 5 DAYS. LOWEST DETECTABLE LIMITS FOR URINE DRUG SCREEN Drug Class                     Cutoff (ng/mL) Amphetamine and metabolites    1000 Barbiturate and metabolites    200 Benzodiazepine                 200 Tricyclics and metabolites     300 Opiates and metabolites        300 Cocaine and metabolites        300 THC                            50 Performed at [facility], [address], [place], [place] [address]   Respiratory Panel by RT PCR (Flu A&B, Covid) - Nasopharyngeal Swab     Status: None   Collection Time: 03/27/19  3:15 PM   Specimen: Nasopharyngeal Swab  Result Value Ref Range   SARS Coronavirus 2 by RT PCR NEGATIVE NEGATIVE    Comment: (NOTE) SARS-CoV-2 target nucleic acids are NOT DETECTED. The SARS-CoV-2 RNA is generally detectable in upper respiratoy specimens during the acute phase of infection. The lowest concentration of SARS-CoV-2 viral copies this assay can detect is 131 copies/mL. A negative result does not preclude SARS-Cov-2 infection and should not be used as the sole basis for treatment or other patient management decisions. A negative result may occur with  improper specimen collection/handling, submission of specimen other than nasopharyngeal swab, presence of viral mutation(s) within the areas targeted by this assay, and inadequate number of viral copies (<131 copies/mL). A negative result must be combined with clinical observations, patient history, and epidemiological information. The expected result is Negative. Fact Sheet for Patients:  https://www.moore.com/ Fact Sheet for Healthcare Providers:   https://www.young.biz/ This test is not yet ap proved or cleared by the [place] FDA and  has been authorized for detection and/or diagnosis of SARS-CoV-2 by FDA under an Emergency Use Authorization (EUA). This EUA will remain  in effect (meaning this test can be used) for the duration of the COVID-19 declaration under Section 564(b)(1) of the Act, 21 U.S.C. section 360bbb-3(b)(1), unless the authorization is terminated or revoked sooner.    Influenza A by PCR NEGATIVE NEGATIVE   Influenza B by PCR NEGATIVE NEGATIVE    Comment: (NOTE) The Xpert Xpress SARS-CoV-2/FLU/RSV assay is intended as an aid in  the diagnosis of influenza from Nasopharyngeal swab specimens and  should not be used as a sole basis for treatment. Nasal washings and  aspirates are unacceptable for Xpert Xpress SARS-CoV-2/FLU/RSV  testing. Fact Sheet for Patients: https://www.moore.com/ Fact Sheet for Healthcare Providers: https://www.young.biz/ This test is not yet approved or cleared by the [place] FDA and  has been authorized for detection and/or diagnosis of SARS-CoV-2 by  FDA under an Emergency Use Authorization (EUA). This EUA will remain  in effect (meaning this test can be used) for the duration of the  Covid-19 declaration under Section 564(b)(1) of the Act, 21  U.S.C. section 360bbb-3(b)(1), unless the authorization is  terminated or revoked. Performed at [facility], [address], [place], [place] [address]   Lactic acid, plasma     Status: Abnormal  Collection Time: 03/27/19  4:33 PM  Result Value Ref Range   Lactic Acid, Venous 2.0 (HH) 0.5 - 1.9 mmol/L    Comment: CRITICAL RESULT CALLED TO, READ BACK BY AND VERIFIED WITH: HYLTON L. @ 1701 ON [id number] BY HENDERSON L. Performed at [facility], [address] [place], [place] [address]   Troponin I (High Sensitivity)     Status: Abnormal   Collection Time: 03/27/19  4:33 PM   Result Value Ref Range   Troponin I (High Sensitivity) 19 (H) <18 ng/L    Comment: (NOTE) Elevated high sensitivity troponin I (hsTnI) values and significant  changes across serial measurements may suggest ACS but many other  chronic and acute conditions are known to elevate hsTnI results.  Refer to the "Links" section for chest pain algorithms and additional  guidance. Performed at [facility], [address] [place], [place] [address]   Glucose, capillary     Status: Abnormal   Collection Time: 03/27/19  4:45 PM  Result Value Ref Range   Glucose-Capillary >600 (HH) 70 - 99 mg/dL    Comment: Glucose reference range applies only to samples taken after fasting for at least 8 hours.  MRSA PCR Screening     Status: None   Collection Time: 03/27/19  4:57 PM   Specimen: Nasal Mucosa; Nasopharyngeal  Result Value Ref Range   MRSA by PCR NEGATIVE NEGATIVE    Comment:        The GeneXpert MRSA Assay (FDA approved for NASAL specimens only), is one component of a comprehensive MRSA colonization surveillance program. It is not intended to diagnose MRSA infection nor to guide or monitor treatment for MRSA infections. Performed at [facility], [address], [place], [place] [address]   Glucose, capillary     Status: Abnormal   Collection Time: 03/27/19  5:23 PM  Result Value Ref Range   Glucose-Capillary 500 (H) 70 - 99 mg/dL    Comment: Glucose reference range applies only to samples taken after fasting for at least 8 hours.  Glucose, capillary     Status: Abnormal   Collection Time: 03/27/19  6:21 PM  Result Value Ref Range   Glucose-Capillary 565 (HH) 70 - 99 mg/dL    Comment: Glucose reference range applies only to samples taken after fasting for at least 8 hours.   Comment 1 Notify RN   Glucose, capillary     Status: Abnormal   Collection Time: 03/27/19  7:08 PM  Result Value Ref Range   Glucose-Capillary >600 (HH) 70 - 99 mg/dL    Comment: Glucose reference range applies  only to samples taken after fasting for at least 8 hours.  Basic metabolic panel     Status: Abnormal   Collection Time: 03/27/19  7:10 PM  Result Value Ref Range   Sodium 149 (H) 135 - 145 mmol/L   Potassium 3.3 (L) 3.5 - 5.1 mmol/L   Chloride 112 (H) 98 - 111 mmol/L   CO2 26 22 - 32 mmol/L   Glucose, Bld 459 (H) 70 - 99 mg/dL    Comment: Glucose reference range applies only to samples taken after fasting for at least 8 hours.   BUN 23 8 - 23 mg/dL   Creatinine, Ser 9.56 0.61 - 1.24 mg/dL   Calcium 8.9 8.9 - 21.3 mg/dL   GFR calc non Af Amer >60 >60 mL/min   GFR calc Af Amer >60 >60 mL/min   Anion gap 11 5 - 15  Comment: Performed at [facility], [address], [place], [place] [address]  Troponin I (High Sensitivity)     Status: Abnormal   Collection Time: 03/27/19  7:10 PM  Result Value Ref Range   Troponin I (High Sensitivity) 21 (H) <18 ng/L    Comment: (NOTE) Elevated high sensitivity troponin I (hsTnI) values and significant  changes across serial measurements may suggest ACS but many other  chronic and acute conditions are known to elevate hsTnI results.  Refer to the "Links" section for chest pain algorithms and additional  guidance. Performed at [facility], [address], [place], [place] [address]   Glucose, capillary     Status: Abnormal   Collection Time: 03/27/19  7:53 PM  Result Value Ref Range   Glucose-Capillary 368 (H) 70 - 99 mg/dL    Comment: Glucose reference range applies only to samples taken after fasting for at least 8 hours.  Glucose, capillary     Status: Abnormal   Collection Time: 03/27/19  9:03 PM  Result Value Ref Range   Glucose-Capillary 282 (H) 70 - 99 mg/dL    Comment: Glucose reference range applies only to samples taken after fasting for at least 8 hours.  Glucose, capillary     Status: Abnormal   Collection Time: 03/27/19  9:42 PM  Result Value Ref Range   Glucose-Capillary 260 (H) 70 - 99 mg/dL    Comment: Glucose reference range  applies only to samples taken after fasting for at least 8 hours.  Glucose, capillary     Status: Abnormal   Collection Time: 03/27/19 10:18 PM  Result Value Ref Range   Glucose-Capillary 211 (H) 70 - 99 mg/dL    Comment: Glucose reference range applies only to samples taken after fasting for at least 8 hours.  Glucose, capillary     Status: Abnormal   Collection Time: 03/28/19 12:04 AM  Result Value Ref Range   Glucose-Capillary 193 (H) 70 - 99 mg/dL    Comment: Glucose reference range applies only to samples taken after fasting for at least 8 hours.  Basic metabolic panel     Status: Abnormal   Collection Time: 03/28/19  3:18 AM  Result Value Ref Range   Sodium 147 (H) 135 - 145 mmol/L   Potassium 3.4 (L) 3.5 - 5.1 mmol/L   Chloride 114 (H) 98 - 111 mmol/L   CO2 26 22 - 32 mmol/L   Glucose, Bld 212 (H) 70 - 99 mg/dL    Comment: Glucose reference range applies only to samples taken after fasting for at least 8 hours.   BUN 18 8 - 23 mg/dL   Creatinine, Ser 0.98 0.61 - 1.24 mg/dL   Calcium 8.3 (L) 8.9 - 10.3 mg/dL   GFR calc non Af Amer >60 >60 mL/min   GFR calc Af Amer >60 >60 mL/min   Anion gap 7 5 - 15    Comment: Performed at [facility], [address] [place], [place] [address]  CBC     Status: None   Collection Time: 03/28/19  3:18 AM  Result Value Ref Range   WBC 8.3 4.0 - 10.5 K/uL   RBC 4.55 4.22 - 5.81 MIL/uL   Hemoglobin 13.2 13.0 - 17.0 g/dL   HCT 78.2 95.6 - 21.3 %   MCV 89.2 80.0 - 100.0 fL   MCH 29.0 26.0 - 34.0 pg   MCHC 32.5 30.0 - 36.0 g/dL   RDW 08.6 57.8 - 46.9 %   Platelets  203 150 - 400 K/uL   nRBC 0.0 0.0 - 0.2 %    Comment: Performed at [facility], [address], [place], [place] [address]  Hemoglobin A1c     Status: Abnormal   Collection Time: 03/28/19  3:18 AM  Result Value Ref Range   Hgb A1c MFr Bld 12.0 (H) 4.8 - 5.6 %    Comment: (NOTE) Pre diabetes:          5.7%-6.4% Diabetes:              >6.4% Glycemic control for   <7.0% adults  with diabetes    Mean Plasma Glucose 297.7 mg/dL    Comment: Performed at [facility] [address] [place], [place] [address]  Glucose, capillary     Status: Abnormal   Collection Time: 03/28/19  3:56 AM  Result Value Ref Range   Glucose-Capillary 231 (H) 70 - 99 mg/dL    Comment: Glucose reference range applies only to samples taken after fasting for at least 8 hours.  Glucose, capillary     Status: Abnormal   Collection Time: 03/28/19  7:42 AM  Result Value Ref Range   Glucose-Capillary 217 (H) 70 - 99 mg/dL    Comment: Glucose reference range applies only to samples taken after fasting for at least 8 hours.  Glucose, capillary     Status: Abnormal   Collection Time: 03/28/19 11:40 AM  Result Value Ref Range   Glucose-Capillary 425 (H) 70 - 99 mg/dL    Comment: Glucose reference range applies only to samples taken after fasting for at least 8 hours.  Glucose, capillary     Status: Abnormal   Collection Time: 03/28/19  4:17 PM  Result Value Ref Range   Glucose-Capillary 273 (H) 70 - 99 mg/dL    Comment: Glucose reference range applies only to samples taken after fasting for at least 8 hours.  Glucose, capillary     Status: Abnormal   Collection Time: 03/28/19  8:05 PM  Result Value Ref Range   Glucose-Capillary 252 (H) 70 - 99 mg/dL    Comment: Glucose reference range applies only to samples taken after fasting for at least 8 hours.  Glucose, capillary     Status: Abnormal   Collection Time: 03/28/19 11:45 PM  Result Value Ref Range   Glucose-Capillary 244 (H) 70 - 99 mg/dL    Comment: Glucose reference range applies only to samples taken after fasting for at least 8 hours.  Glucose, capillary     Status: Abnormal   Collection Time: 03/29/19  3:01 AM  Result Value Ref Range   Glucose-Capillary 290 (H) 70 - 99 mg/dL    Comment: Glucose reference range applies only to samples taken after fasting for at least 8 hours.  Glucose, capillary     Status: Abnormal    Collection Time: 03/29/19  7:35 AM  Result Value Ref Range   Glucose-Capillary 232 (H) 70 - 99 mg/dL    Comment: Glucose reference range applies only to samples taken after fasting for at least 8 hours.  Glucose, capillary     Status: Abnormal   Collection Time: 03/29/19 11:07 AM  Result Value Ref Range   Glucose-Capillary 327 (H) 70 - 99 mg/dL    Comment: Glucose reference range applies only to samples taken after fasting for at least 8 hours.    CT Head Wo Contrast  Result Date: 03/27/2019 CLINICAL DATA:  Altered mental status EXAM: CT HEAD WITHOUT CONTRAST TECHNIQUE:  Contiguous axial images were obtained from the base of the skull through the vertex without intravenous contrast. COMPARISON:  None. FINDINGS: Brain: No evidence of acute infarction, hemorrhage, hydrocephalus, extra-axial collection or mass lesion/mass effect. Vascular: Atherosclerotic calcifications involving the large vessels of the skull base. No unexpected hyperdense vessel. Skull: Normal. Negative for fracture or focal lesion. Sinuses/Orbits: No acute finding. Other: Within the subcutaneous soft tissues of the posterior right neck is a rounded low attenuation structure measuring 1.8 x 1.6 x 1.8 cm (series 3, image 34; series 7, image 66). IMPRESSION: 1. No acute intracranial pathology. 2. Rounded low attenuation structure in the subcutaneous soft tissues of the posterior right neck measuring 1.8 cm. This may represent a sebaceous cyst or other cystic lesion. Correlate with physical exam. Electronically Signed   By: Duanne Guess D.O.   On: 03/27/2019 15:59   CT ABDOMEN PELVIS W CONTRAST  Result Date: 03/27/2019 CLINICAL DATA:  Abdominal pain. EXAM: CT ABDOMEN AND PELVIS WITH CONTRAST TECHNIQUE: Multidetector CT imaging of the abdomen and pelvis was performed using the standard protocol following bolus administration of intravenous contrast. CONTRAST:  OMNIPAQUE IOHEXOL 300 MG/ML  SOLN COMPARISON:  None. FINDINGS: Lower  chest: Mild bibasilar scarring and/or atelectasis is seen. Hepatobiliary: No focal liver abnormality is seen. No gallstones, gallbladder wall thickening, or biliary dilatation. Pancreas: Unremarkable. No pancreatic ductal dilatation or surrounding inflammatory changes. Spleen: Normal in size without focal abnormality. Adrenals/Urinary Tract: Adrenal glands are unremarkable. Kidneys are normal in size. A 4.7 cm cyst is seen along the lateral aspect of the mid right kidney. 3.5 cm, 3.8 cm and 4.3 cm cysts are seen within the left kidney. A 6 mm nonobstructing renal stone is seen within the anterior aspect of the mid right kidney. A 5 mm nonobstructing renal stone is seen within the lower pole of the left kidney. There is no evidence of hydronephrosis or renal obstruction. Bladder is unremarkable. Stomach/Bowel: A 3.4 cm x 2.8 cm gastric diverticulum is seen along the posteromedial aspect of the body of the stomach. Appendix appears normal. No evidence of bowel wall thickening, distention, or inflammatory changes. Noninflamed diverticula are seen within the large bowel. Vascular/Lymphatic: No significant vascular findings are present. No enlarged abdominal or pelvic lymph nodes. Reproductive: Prostate is unremarkable. Other: No abdominal wall hernia or abnormality. No abdominopelvic ascites. Musculoskeletal: Multilevel degenerative changes seen within the lumbar spine. IMPRESSION: 1. Bilateral nonobstructing renal calculi. 2. Noninflamed gastric and colonic diverticula. 3. Multiple bilateral renal cysts. Electronically Signed   By: Aram Candela M.D.   On: 03/27/2019 18:09   US ARTERIAL SEG MULTIPLE LE (ABI, SEGMENTAL PRESSURES, PVR'S)  Result Date: 03/29/2019 CLINICAL DATA:  History of right BKA. Left foot redness with a wound. EXAM: NONINVASIVE PHYSIOLOGIC VASCULAR STUDY OF BILATERAL LOWER EXTREMITIES TECHNIQUE: Non-invasive vascular evaluation of both lower extremities was performed at rest, including  calculation of ankle-brachial indices, multiple segmental pressure evaluation, segmental Doppler and segmental pulse volume recording. COMPARISON:  None. FINDINGS: Right Lower Extremity Resting ABI:  Below the knee amputation Normal triphasic waveforms in the right thigh. Left Lower Extremity: Resting ABI: 1.03 Resting TBI: Not obtained Segmental Pressures: Normal segmental pressures, no significant (20 mmHg) pressure gradient between adjacent segments. Arterial Waveforms: Waveforms at the left common femoral artery are slightly irregular. Biphasic waveforms at the left SFA. Triphasic waveforms at the left popliteal artery. Triphasic waveforms at the left posterior tibial artery. Monophasic waveforms at the left dorsalis pedis artery. PVRs: Normal PVRs with maintained waveform amplitude and  quality. Ankle Brachial index > 1.4 Non diagnostic secondary to incompressible vessel calcifications 1.0-1.4       Normal 0.9-0.99     Borderline PAD 0.8-0.89     Mild PAD 0.5-0.79     Moderate PAD < 0.5          Severe PAD IMPRESSION: Normal left ankle-brachial index, measuring 1.03. Left lower extremity waveforms are normal with exception of the left dorsalis pedis artery. Electronically Signed   By: Richarda Overlie M.D.   On: 03/29/2019 12:57   DG Chest Port 1 View  Result Date: 03/28/2019 CLINICAL DATA:  PICC line placement EXAM: PORTABLE CHEST 1 VIEW COMPARISON:  March 27, 2019 FINDINGS: The right-sided PICC line terminates over the SVC. The heart size is stable from prior study. There is some atelectasis at the lung bases without evidence for large focal infiltrate. There is no pneumothorax. No large pleural effusion. IMPRESSION: Well position right-sided PICC line. Otherwise stable appearance of the chest. Electronically Signed   By: Katherine Mantle M.D.   On: 03/28/2019 21:05   DG Chest Port 1 View  Result Date: 03/27/2019 CLINICAL DATA:  Altered mental status. Nonverbal. EXAM: PORTABLE CHEST 1 VIEW COMPARISON:   03/11/2019 FINDINGS: Low lung volumes. There is patchy infiltrate at the LEFT lung base, new since the prior study. No pulmonary edema. No pleural effusions. Remote resection of the distal LEFT clavicle. IMPRESSION: New LEFT lower lobe infiltrate. Electronically Signed   By: Norva Pavlov M.D.   On: 03/27/2019 14:24   DG Foot 2 Views Left  Result Date: 03/27/2019 CLINICAL DATA:  Nonverbal. Ulcer of the bottom of the LEFT foot. RIGHT BKA. EXAM: LEFT FOOT - 2 VIEW COMPARISON:  None. FINDINGS: Significant soft tissue swelling of the forefoot. Remote segmental resection or trauma of the 5th metatarsal. No acute fracture or subluxation. No radiopaque foreign body or soft tissue gas. Achilles calcaneal spur noted. IMPRESSION: Soft tissue swelling. No evidence for acute osseous abnormality. Electronically Signed   By: Norva Pavlov M.D.   On: 03/27/2019 14:26   ECHOCARDIOGRAM COMPLETE  Result Date: 03/28/2019    ECHOCARDIOGRAM REPORT   Patient Name:   ZAYLAN KISSOON Date of Exam: 03/28/2019 Medical Rec #:  [id number]    Height:       74.0 in Accession #:    [id number]   Weight:       265.2 lb Date of Birth:  June 30, 1955     BSA:          2.450 m Patient Age:    63 years     BP:           154/101 mmHg Patient Gender: M            HR:           91 bpm. [facility] Procedure: 2D Echo, Cardiac Doppler and Color Doppler Indications:    Atrial Fibrillation 427.31 / I48.91  History:        Patient has no prior history of Echocardiogram examinations.                 Risk Factors:Hypertension and Diabetes. Schizophrenia (HCC)                 (From Hx).  Sonographer:    Celesta Gentile RCS Referring Phys: [phone] Heloise Beecham [facility]  Sonographer Comments: Somewhat difficult due to patient not cooperating and combative at times. IMPRESSIONS  1. Left ventricular ejection fraction, by estimation, is  60 to 65%. The left ventricle has normal function. The left ventricle has no regional wall motion abnormalities. Left  ventricular diastolic parameters are consistent with Grade I diastolic dysfunction (impaired relaxation).  2. Right ventricular systolic function is normal. The right ventricular size is normal.  3. The mitral valve is normal in structure. No evidence of mitral valve regurgitation. No evidence of mitral stenosis.  4. The aortic valve is tricuspid. Aortic valve regurgitation is not visualized. Mild to moderate aortic valve sclerosis/calcification is present, without any evidence of aortic stenosis.  5. The inferior vena cava is normal in size with greater than 50% respiratory variability, suggesting right atrial pressure of 3 mmHg. FINDINGS  Left Ventricle: Left ventricular ejection fraction, by estimation, is 60 to 65%. The left ventricle has normal function. The left ventricle has no regional wall motion abnormalities. The left ventricular internal cavity size was normal in size. There is  no left ventricular hypertrophy. Left ventricular diastolic parameters are consistent with Grade I diastolic dysfunction (impaired relaxation). Right Ventricle: The right ventricular size is normal. No increase in right ventricular wall thickness. Right ventricular systolic function is normal. Left Atrium: Left atrial size was normal in size. Right Atrium: Right atrial size was normal in size. Pericardium: There is no evidence of pericardial effusion. Mitral Valve: The mitral valve is normal in structure. Normal mobility of the mitral valve leaflets. No evidence of mitral valve regurgitation. No evidence of mitral valve stenosis. Tricuspid Valve: The tricuspid valve is normal in structure. Tricuspid valve regurgitation is not demonstrated. No evidence of tricuspid stenosis. Aortic Valve: The aortic valve is tricuspid. . There is moderate thickening and moderate calcification of the aortic valve. Aortic valve regurgitation is not visualized. Mild to moderate aortic valve sclerosis/calcification is present, without any evidence of  aortic stenosis. There is moderate thickening of the aortic valve. There is moderate calcification of the aortic valve. Pulmonic Valve: The pulmonic valve was normal in structure. Pulmonic valve regurgitation is not visualized. No evidence of pulmonic stenosis. Aorta: The aortic root is normal in size and structure. Venous: The inferior vena cava is normal in size with greater than 50% respiratory variability, suggesting right atrial pressure of 3 mmHg. IAS/Shunts: No atrial level shunt detected by color flow Doppler.  LEFT VENTRICLE PLAX 2D LVIDd:         5.26 cm  Diastology LVIDs:         3.22 cm  LV e' lateral:   10.20 cm/s LV PW:         1.12 cm  LV E/e' lateral: 11.4 LV IVS:        1.18 cm  LV e' medial:    7.07 cm/s LVOT diam:     1.90 cm  LV E/e' medial:  16.4 LV SV:         57 LV SV Index:   23 LVOT Area:     2.84 cm  RIGHT VENTRICLE RV Mid diam:    2.65 cm RV S prime:     10.40 cm/s TAPSE (M-mode): 2.2 cm LEFT ATRIUM             Index LA diam:        3.55 cm 1.45 cm/m LA Vol (A2C):   59.7 ml 24.36 ml/m LA Vol (A4C):   70.5 ml 28.77 ml/m LA Biplane Vol: 68.9 ml 28.12 ml/m  AORTIC VALVE LVOT Vmax:   95.30 cm/s LVOT Vmean:  70.800 cm/s LVOT VTI:    0.201 m  AORTA Ao Root diam: 3.15 cm MITRAL VALVE MV Area (PHT): 3.17 cm     SHUNTS MV Decel Time: 239 msec     Systemic VTI:  0.20 m MV E velocity: 116.00 cm/s  Systemic Diam: 1.90 cm MV A velocity: 109.00 cm/s MV E/A ratio:  1.06 Donato SchultzMark Skains MD Electronically signed by Donato SchultzMark Skains MD Signature Date/Time: 03/28/2019/12:54:20 PM    Final    US EKG SITE RITE  Result Date: 03/28/2019 If Site Rite image not attached, placement could not be confirmed due to current cardiac rhythm.   ROS:  Pertinent items are noted in HPI.  Blood pressure (!) 155/100, pulse (!) 114, temperature 97.8 F (36.6 C), temperature source Oral, resp. rate (!) 27, height 6\' 2"  (1.88 m), weight 120.3 kg, SpO2 92 %. Physical Exam:  General: Alert and oriented. Able to answer  questions. Cardiovascular: RRR, S1, S2, normal. No murmurs, rubs, gallops, or clicks. Respiratory: Lungs clear to auscultation bilaterally. Normal respiratory effort. GI: Normal bowel sounds present. Non tender to palpation. No distention. MSK: Right below the knee amputation. Skin: Ulceration in left lower extremity without erythema or tenderness. Psych: Normal mood and affect.  Assessment/Plan: Interpretation: Given the patients past medical history of ulceration with right below the knee amputation and poorly maintained diabetes mellitus this is likely a diabetic ulcer. The patients glucose has improved from 733 on admission (3/20) to 327 today. The wound is progressing well and lacks erythema or tenderness. The patients WBC was 8.3.  Plan: Ordered an ABI to assess peripheral vascular flow in left lower extremity. Consider consulting vascular if imaging indicates significant vascular stenosis. Continue Santyl and wet to dry wound dressings. Maintain glycemic control. Will continue to monitor patient progression.  Eliezer LoftsJake T Jung Yurchak 03/29/2019, 1:49 PM

## 2019-03-29 NOTE — TOC Initial Note (Addendum)
Transition of Care [facility]) - Initial/Assessment Note    Patient Details  Name: Darren West MRN: [id number] Date of Birth: Aug 24, 1955  [facility] CM/SW Contact:    Ihor Gully, LCSW Phone Number: 03/29/2019, 3:38 PM  Clinical Narrative:                 Patient admitted. Has been staying a few days at the [facility]. [employer] paid for one day and he paid for a few more days leading up to his hospitalization. All of patient's belongings are at the [facility]. Patient is is frustrated that TOC is unable to go to the [facility] and gather his belongings. PT evaluation and recommendations discussed. Patient agreeable to SNF. Patient has [employer]. If authorized he will have copays AFTER 20 days of 172.00 per day.  Message left for [facility] APS regarding making APS report.   Expected Discharge Plan: Skilled Nursing Facility Barriers to Discharge: Continued Medical Work up   Patient Goals and CMS Choice     Choice offered to / list presented to : Patient  Expected Discharge Plan and Services Expected Discharge Plan: [place] Acute Care Choice: [place] Living arrangements for the past 2 months: Hotel/Motel                                      Prior Living Arrangements/Services Living arrangements for the past 2 months: Hotel/Motel Lives with:: Self Patient language and need for interpreter reviewed:: Yes Do you feel safe going back to the place where you live?: Yes      Need for Family Participation in Patient Care: Yes (Comment) Care giver support system in place?: Yes (comment) Current home services: DME Criminal Activity/Legal Involvement Pertinent to Current Situation/Hospitalization: No - Comment as needed  Activities of Daily Living Home Assistive Devices/Equipment: Wheelchair ADL Screening (condition at time of admission) Patient's cognitive ability adequate to safely  complete daily activities?: Yes Is the patient deaf or have difficulty hearing?: No Does the patient have difficulty seeing, even when wearing glasses/contacts?: No Does the patient have difficulty concentrating, remembering, or making decisions?: Yes Patient able to express need for assistance with ADLs?: Yes Does the patient have difficulty dressing or bathing?: No Independently performs ADLs?: No Communication: Needs assistance Is this a change from baseline?: Pre-admission baseline Dressing (OT): Needs assistance Is this a change from baseline?: Pre-admission baseline Grooming: Needs assistance Is this a change from baseline?: Pre-admission baseline Feeding: Needs assistance Is this a change from baseline?: Pre-admission baseline Bathing: Needs assistance Is this a change from baseline?: Pre-admission baseline Toileting: Needs assistance Is this a change from baseline?: Pre-admission baseline In/Out Bed: Needs assistance Is this a change from baseline?: Pre-admission baseline Walks in Home: Needs assistance Is this a change from baseline?: Pre-admission baseline Does the patient have difficulty walking or climbing stairs?: Yes Weakness of Legs: Both Weakness of Arms/Hands: Both  Permission Sought/Granted                  Emotional Assessment Appearance:: Appears stated age   Affect (typically observed): Frustrated Orientation: : Oriented to Self, Oriented to Place, Oriented to Situation, Oriented to  Time Alcohol / Substance Use: Not Applicable Psych Involvement: No (comment)  Admission diagnosis:  Infection [B99.9] Hyperglycemia [R73.9] Cellulitis of foot, left [L03.116] Atrial fibrillation with RVR ([place]) [I48.91] HCAP (healthcare-associated  pneumonia) [J18.9] Patient Active Problem List   Diagnosis Date Noted  . Hyperglycemia 03/27/2019  . Uncontrolled diabetes mellitus (HCC) 03/27/2019  . Essential hypertension 03/27/2019  . S/P BKA (below knee amputation)  unilateral, right (HCC) 03/27/2019  . HCAP (healthcare-associated pneumonia) 03/27/2019  . Diabetic ulcer of left foot (HCC) 03/27/2019  . Homelessness 03/12/2019   PCP:  Patient, No Pcp Per Pharmacy:   [id number] - [place], [place] - [address] AT [facility] OF [address] [place] [address] Phone: [phone] Fax: [phone]     Social Determinants of Health (SDOH) Interventions    Readmission Risk Interventions No flowsheet data found.

## 2019-03-29 NOTE — Progress Notes (Signed)
PROGRESS NOTE    Amanda Pote  ZOX:[id number] DOB: [birth year] DOA: 03/27/2019 PCP: Patient, No Pcp Per     Brief Narrative:  As per H&P written by Dr. Mariea Clonts on 03/27/2019  64 y.o. male with medical history significant for diabetes mellitus, hypertension, schizophrenia current suicidal ideations, right below-knee amputation. Patient was recently discharged from a behavioural health facility 3/12-3/15 after hospitalization for suicidal ideations.  He was discharged to a hotel because he was homeless and was able to stay about 3 days.  Also called EMS because patient had slid out of w/c , and was repeating his nephew's name continuously when he was questioned. On my evaluation, patient is lethargic, answers yes or no, I am unsure if this answers are accurate.  Still confused and not able to give me details.  He denies cough or difficulty breathing, he denies chest pain, he points to his left lower abdomen when asked if he has pain.  He is unable to tell me if he has been compliant with his medications.  ED Course: Temperature 98.6.  Tachycardic to 112, blood pressure systolic 150s to 811B.  Blood glucose elevated at 733, normal anion gap 13, serum bicarb 29.  Lactic acid elevated 2.5, normal WBC 8.1.  Chest x-ray showed left lower lobe infiltrate.  X-ray of the left foot showed soft tissue swelling abnormality.  EKG showed atrial fibrillation rate 103-no prior EKGs.  Patient was started on IV vancomycin and cefepime for HCAP.  Patient was also started on insulin drip.   Assessment & Plan: 1-honk: Uncontrolled type 2 diabetes with hyperglycemia  -elevated A1c of 11.3 demonstrating poor control at baseline. -Continue sliding scale insulin, modified carbohydrate diet and initiation of long-acting twice daily -Follow CBGs and further adjust regimen as needed. -Continue holding oral hypoglycemic agents while inpatient. -Medication noncompliance is a leading factor for patient uncontrolled  sugar.  2-transient atrial fibrillation in the setting of hyperglycemia, HCAP and rebound from absent baseline beta-blocker usage. -Continue metoprolol -Patient back to sinus rhythm and has remained stable.  Will discontinue telemetry. -Normal TSH appreciated -No long term anticoagulation anticipated secondary to poor compliance and transient event.  3-HCAP/left lower extremity cellulitis From diabetic ulcer. -Follow wound care services recommendations -Continue current antibiotics -Patient is afebrile and with normal WBC; sepsis features present on admission essentially resolved. -Follow clinical response. -Check lower extremity arterial Dopplers and follow recommendation by general surgery regarding need for surgical debridement.  4-elevated blood pressure -Resume home beta-blocker, Flomax and Proscar  5-BPH -Resume Flomax and Proscar -Patient reports no urinary retention symptoms.  6-status post right BKA -No open wounds appreciated on his stump -Continue to provide assistance and supportive care as needed.  7-class I obesity -Body mass index is 34.05 kg/m. -Low calorie diet, portion control and increase physical activity discussed with patient.  8-diabetic neuropathy -Resume Neurontin and as needed use of baclofen  9-depression -Continue Cymbalta -No hallucinations or suicidal ideation currently -Mood overall stable -No need for bedside sitter.    DVT prophylaxis: Lovenox Code Status: Full code Family Communication: No family at bedside. Disposition Plan: Remains in the hospital; continue adjusting insulin therapy based on his sugar levels, follow cultures results and continue current IV antibiotics.  Follow wound care service recommendations, general surgery evaluation and lower extremity arterial Doppler.  Consultants:   Wound care service  Procedures:   See below for x-ray reports.  Antimicrobials:  Anti-infectives (From admission, onward)   Start      Dose/Rate Route Frequency Ordered Stop  03/28/19 0200  vancomycin (VANCOCIN) IVPB 1000 mg/200 mL premix     1,000 mg 200 mL/hr over 60 Minutes Intravenous Every 12 hours 03/27/19 1348     03/27/19 2300  metroNIDAZOLE (FLAGYL) IVPB 500 mg     500 mg 100 mL/hr over 60 Minutes Intravenous Every 8 hours 03/27/19 1639     03/27/19 2000  ceFEPIme (MAXIPIME) 2 g in sodium chloride 0.9 % 100 mL IVPB     2 g 200 mL/hr over 30 Minutes Intravenous Every 8 hours 03/27/19 1348     03/27/19 1346  vancomycin (VANCOREADY) IVPB 2000 mg/400 mL     2,000 mg 200 mL/hr over 120 Minutes Intravenous  Once 03/27/19 1346 03/27/19 1718   03/27/19 1345  ceFEPIme (MAXIPIME) 2 g in sodium chloride 0.9 % 100 mL IVPB     2 g 200 mL/hr over 30 Minutes Intravenous  Once 03/27/19 1341 03/27/19 1512   03/27/19 1345  metroNIDAZOLE (FLAGYL) IVPB 500 mg     500 mg 100 mL/hr over 60 Minutes Intravenous  Once 03/27/19 1341 03/27/19 1621   03/27/19 1345  vancomycin (VANCOCIN) IVPB 1000 mg/200 mL premix  Status:  Discontinued     1,000 mg 200 mL/hr over 60 Minutes Intravenous  Once 03/27/19 1341 03/27/19 1346       Subjective: No fever, no chest pain, no nausea, no vomiting.  Patient denies palpitation and expressed no suicidal ideation or hallucinations.  Complaining of neuropathy and bilateral shoulder pain.  Objective: Vitals:   03/29/19 0827 03/29/19 0900 03/29/19 0904 03/29/19 0905  BP:   (!) 167/109   Pulse:  78 83 83  Resp:  (!) 25 (!) 22 (!) 22  Temp:      TempSrc:      SpO2: 100% 97% 93% 93%  Weight:      Height:        Intake/Output Summary (Last 24 hours) at 03/29/2019 1336 Last data filed at 03/29/2019 1248 Gross per 24 hour  Intake 2284.63 ml  Output 4100 ml  Net -1815.37 ml   Filed Weights   03/27/19 1247 03/27/19 1700 03/28/19 0630  Weight: 120.7 kg 117.8 kg 120.3 kg    Examination: General exam: Alert, awake and cooperative with examination; patient denies suicidal ideation or  hallucinations.  Expressed significant joint pain (bilateral shoulders) and also neuropathy.  He also expressed intermittent episode of muscle spasm.  CBGs better but are still elevated and fluctuating. Respiratory system: Clear to auscultation. Respiratory effort normal. Cardiovascular system:RRR. No murmurs, rubs, gallops. Gastrointestinal system: Abdomen is nondistended, soft and nontender. No organomegaly or masses felt. Normal bowel sounds heard. Central nervous system: Alert and oriented. No focal neurological deficits. Extremities/skin: No cyanosis or clubbing; improved erythematous changes appreciated on his left foot; sole ulcer with some darkness and central necrotic changes. No significant odor or drainage. Right BKA. Psychiatry: Mood & affect appropriate.   Data Reviewed: I have personally reviewed following labs and imaging studies  CBC: Recent Labs  Lab 03/27/19 1410 03/28/19 0318  WBC 8.1 8.3  NEUTROABS 6.3  --   HGB 16.3 13.2  HCT 50.2 40.6  MCV 90.5 89.2  PLT 263 203   Basic Metabolic Panel: Recent Labs  Lab 03/27/19 1410 03/27/19 1910 03/28/19 0318  NA 146* 149* 147*  K 3.9 3.3* 3.4*  CL 104 112* 114*  CO2 29 26 26   GLUCOSE 733* 459* 212*  BUN 27* 23 18  CREATININE 1.17 1.10 0.90  CALCIUM 9.9 8.9 8.3*  MG 2.2  --   --    GFR: Estimated Creatinine Clearance: 114.2 mL/min (by C-G formula based on SCr of 0.9 mg/dL).   Liver Function Tests: Recent Labs  Lab 03/27/19 1410  AST 12*  ALT 12  ALKPHOS 172*  BILITOT 0.7  PROT 8.9*  ALBUMIN 3.9   Coagulation Profile: Recent Labs  Lab 03/27/19 1410  INR 1.1   CBG: Recent Labs  Lab 03/28/19 2005 03/28/19 2345 03/29/19 0301 03/29/19 0735 03/29/19 1107  GLUCAP 252* 244* 290* 232* 327*   Urine analysis:    Component Value Date/Time   COLORURINE STRAW (A) 03/27/2019 1341   APPEARANCEUR CLEAR 03/27/2019 1341   LABSPEC 1.031 (H) 03/27/2019 1341   PHURINE 6.0 03/27/2019 1341   GLUCOSEU >=500  (A) 03/27/2019 1341   HGBUR SMALL (A) 03/27/2019 1341   BILIRUBINUR NEGATIVE 03/27/2019 1341   KETONESUR 5 (A) 03/27/2019 1341   PROTEINUR 100 (A) 03/27/2019 1341   NITRITE NEGATIVE 03/27/2019 1341   LEUKOCYTESUR NEGATIVE 03/27/2019 1341    Recent Results (from the past 240 hour(s))  Urine culture     Status: Abnormal   Collection Time: 03/27/19  1:41 PM   Specimen: In/Out Cath Urine  Result Value Ref Range Status   Specimen Description   Final    IN/OUT CATH URINE Performed at [facility], [address] [place], [place] [address]    Special Requests   Final    NONE Performed at [facility], [address] [place], [place] [address]    Culture MULTIPLE SPECIES PRESENT, SUGGEST RECOLLECTION (A)  Final   Report Status 03/29/2019 FINAL  Final  Blood Culture (routine x 2)     Status: None (Preliminary result)   Collection Time: 03/27/19  1:46 PM   Specimen: BLOOD RIGHT HAND  Result Value Ref Range Status   Specimen Description   Final    BLOOD RIGHT HAND BOTTLES DRAWN AEROBIC AND ANAEROBIC   Special Requests   Final    Blood Culture results may not be optimal due to an inadequate volume of blood received in culture bottles   Culture   Final    NO GROWTH 2 DAYS Performed at [facility], [address] [place], [place] [address]    Report Status PENDING  Incomplete  Blood Culture (routine x 2)     Status: None (Preliminary result)   Collection Time: 03/27/19  2:23 PM   Specimen: BLOOD RIGHT WRIST  Result Value Ref Range Status   Specimen Description   Final    BLOOD RIGHT WRIST BOTTLES DRAWN AEROBIC AND ANAEROBIC   Special Requests   Final    Blood Culture results may not be optimal due to an inadequate volume of blood received in culture bottles   Culture   Final    NO GROWTH 2 DAYS Performed at [facility], [address], [place], [place] [address]    Report Status PENDING  Incomplete  Respiratory Panel by RT PCR (Flu A&B, Covid) - Nasopharyngeal Swab     Status:  None   Collection Time: 03/27/19  3:15 PM   Specimen: Nasopharyngeal Swab  Result Value Ref Range Status   SARS Coronavirus 2 by RT PCR NEGATIVE NEGATIVE Final    Comment: (NOTE) SARS-CoV-2 target nucleic acids are NOT DETECTED. The SARS-CoV-2 RNA is generally detectable in upper respiratoy specimens during the acute phase of infection. The lowest concentration of SARS-CoV-2 viral copies this assay can detect is 131 copies/mL. A negative result does not  preclude SARS-Cov-2 infection and should not be used as the sole basis for treatment or other patient management decisions. A negative result may occur with  improper specimen collection/handling, submission of specimen other than nasopharyngeal swab, presence of viral mutation(s) within the areas targeted by this assay, and inadequate number of viral copies (<131 copies/mL). A negative result must be combined with clinical observations, patient history, and epidemiological information. The expected result is Negative. Fact Sheet for Patients:  https://www.moore.com/ Fact Sheet for Healthcare Providers:  https://www.young.biz/ This test is not yet ap proved or cleared by the [place] FDA and  has been authorized for detection and/or diagnosis of SARS-CoV-2 by FDA under an Emergency Use Authorization (EUA). This EUA will remain  in effect (meaning this test can be used) for the duration of the COVID-19 declaration under Section 564(b)(1) of the Act, 21 U.S.C. section 360bbb-3(b)(1), unless the authorization is terminated or revoked sooner.    Influenza A by PCR NEGATIVE NEGATIVE Final   Influenza B by PCR NEGATIVE NEGATIVE Final    Comment: (NOTE) The Xpert Xpress SARS-CoV-2/FLU/RSV assay is intended as an aid in  the diagnosis of influenza from Nasopharyngeal swab specimens and  should not be used as a sole basis for treatment. Nasal washings and  aspirates are unacceptable for Xpert  Xpress SARS-CoV-2/FLU/RSV  testing. Fact Sheet for Patients: https://www.moore.com/ Fact Sheet for Healthcare Providers: https://www.young.biz/ This test is not yet approved or cleared by the [place] FDA and  has been authorized for detection and/or diagnosis of SARS-CoV-2 by  FDA under an Emergency Use Authorization (EUA). This EUA will remain  in effect (meaning this test can be used) for the duration of the  Covid-19 declaration under Section 564(b)(1) of the Act, 21  U.S.C. section 360bbb-3(b)(1), unless the authorization is  terminated or revoked. Performed at [facility], [address], [place], [place] [address]   MRSA PCR Screening     Status: None   Collection Time: 03/27/19  4:57 PM   Specimen: Nasal Mucosa; Nasopharyngeal  Result Value Ref Range Status   MRSA by PCR NEGATIVE NEGATIVE Final    Comment:        The GeneXpert MRSA Assay (FDA approved for NASAL specimens only), is one component of a comprehensive MRSA colonization surveillance program. It is not intended to diagnose MRSA infection nor to guide or monitor treatment for MRSA infections. Performed at [facility], [address], [place], [place] [address]      Radiology Studies: CT Head Wo Contrast  Result Date: 03/27/2019 CLINICAL DATA:  Altered mental status EXAM: CT HEAD WITHOUT CONTRAST TECHNIQUE: Contiguous axial images were obtained from the base of the skull through the vertex without intravenous contrast. COMPARISON:  None. FINDINGS: Brain: No evidence of acute infarction, hemorrhage, hydrocephalus, extra-axial collection or mass lesion/mass effect. Vascular: Atherosclerotic calcifications involving the large vessels of the skull base. No unexpected hyperdense vessel. Skull: Normal. Negative for fracture or focal lesion. Sinuses/Orbits: No acute finding. Other: Within the subcutaneous soft tissues of the posterior right neck is a rounded low attenuation  structure measuring 1.8 x 1.6 x 1.8 cm (series 3, image 34; series 7, image 66). IMPRESSION: 1. No acute intracranial pathology. 2. Rounded low attenuation structure in the subcutaneous soft tissues of the posterior right neck measuring 1.8 cm. This may represent a sebaceous cyst or other cystic lesion. Correlate with physical exam. Electronically Signed   By: Duanne Guess D.O.   On: 03/27/2019 15:59   CT ABDOMEN PELVIS  W CONTRAST  Result Date: 03/27/2019 CLINICAL DATA:  Abdominal pain. EXAM: CT ABDOMEN AND PELVIS WITH CONTRAST TECHNIQUE: Multidetector CT imaging of the abdomen and pelvis was performed using the standard protocol following bolus administration of intravenous contrast. CONTRAST:  OMNIPAQUE IOHEXOL 300 MG/ML  SOLN COMPARISON:  None. FINDINGS: Lower chest: Mild bibasilar scarring and/or atelectasis is seen. Hepatobiliary: No focal liver abnormality is seen. No gallstones, gallbladder wall thickening, or biliary dilatation. Pancreas: Unremarkable. No pancreatic ductal dilatation or surrounding inflammatory changes. Spleen: Normal in size without focal abnormality. Adrenals/Urinary Tract: Adrenal glands are unremarkable. Kidneys are normal in size. A 4.7 cm cyst is seen along the lateral aspect of the mid right kidney. 3.5 cm, 3.8 cm and 4.3 cm cysts are seen within the left kidney. A 6 mm nonobstructing renal stone is seen within the anterior aspect of the mid right kidney. A 5 mm nonobstructing renal stone is seen within the lower pole of the left kidney. There is no evidence of hydronephrosis or renal obstruction. Bladder is unremarkable. Stomach/Bowel: A 3.4 cm x 2.8 cm gastric diverticulum is seen along the posteromedial aspect of the body of the stomach. Appendix appears normal. No evidence of bowel wall thickening, distention, or inflammatory changes. Noninflamed diverticula are seen within the large bowel. Vascular/Lymphatic: No significant vascular findings are present. No enlarged  abdominal or pelvic lymph nodes. Reproductive: Prostate is unremarkable. Other: No abdominal wall hernia or abnormality. No abdominopelvic ascites. Musculoskeletal: Multilevel degenerative changes seen within the lumbar spine. IMPRESSION: 1. Bilateral nonobstructing renal calculi. 2. Noninflamed gastric and colonic diverticula. 3. Multiple bilateral renal cysts. Electronically Signed   By: Aram Candela M.D.   On: 03/27/2019 18:09   US ARTERIAL SEG MULTIPLE LE (ABI, SEGMENTAL PRESSURES, PVR'S)  Result Date: 03/29/2019 CLINICAL DATA:  History of right BKA. Left foot redness with a wound. EXAM: NONINVASIVE PHYSIOLOGIC VASCULAR STUDY OF BILATERAL LOWER EXTREMITIES TECHNIQUE: Non-invasive vascular evaluation of both lower extremities was performed at rest, including calculation of ankle-brachial indices, multiple segmental pressure evaluation, segmental Doppler and segmental pulse volume recording. COMPARISON:  None. FINDINGS: Right Lower Extremity Resting ABI:  Below the knee amputation Normal triphasic waveforms in the right thigh. Left Lower Extremity: Resting ABI: 1.03 Resting TBI: Not obtained Segmental Pressures: Normal segmental pressures, no significant (20 mmHg) pressure gradient between adjacent segments. Arterial Waveforms: Waveforms at the left common femoral artery are slightly irregular. Biphasic waveforms at the left SFA. Triphasic waveforms at the left popliteal artery. Triphasic waveforms at the left posterior tibial artery. Monophasic waveforms at the left dorsalis pedis artery. PVRs: Normal PVRs with maintained waveform amplitude and quality. Ankle Brachial index > 1.4 Non diagnostic secondary to incompressible vessel calcifications 1.0-1.4       Normal 0.9-0.99     Borderline PAD 0.8-0.89     Mild PAD 0.5-0.79     Moderate PAD < 0.5          Severe PAD IMPRESSION: Normal left ankle-brachial index, measuring 1.03. Left lower extremity waveforms are normal with exception of the left dorsalis  pedis artery. Electronically Signed   By: Richarda Overlie M.D.   On: 03/29/2019 12:57   DG Chest Port 1 View  Result Date: 03/28/2019 CLINICAL DATA:  PICC line placement EXAM: PORTABLE CHEST 1 VIEW COMPARISON:  March 27, 2019 FINDINGS: The right-sided PICC line terminates over the SVC. The heart size is stable from prior study. There is some atelectasis at the lung bases without evidence for large focal infiltrate. There is no  pneumothorax. No large pleural effusion. IMPRESSION: Well position right-sided PICC line. Otherwise stable appearance of the chest. Electronically Signed   By: Constance Holster M.D.   On: 03/28/2019 21:05   DG Chest Port 1 View  Result Date: 03/27/2019 CLINICAL DATA:  Altered mental status. Nonverbal. EXAM: PORTABLE CHEST 1 VIEW COMPARISON:  03/11/2019 FINDINGS: Low lung volumes. There is patchy infiltrate at the LEFT lung base, new since the prior study. No pulmonary edema. No pleural effusions. Remote resection of the distal LEFT clavicle. IMPRESSION: New LEFT lower lobe infiltrate. Electronically Signed   By: Nolon Nations M.D.   On: 03/27/2019 14:24   DG Foot 2 Views Left  Result Date: 03/27/2019 CLINICAL DATA:  Nonverbal. Ulcer of the bottom of the LEFT foot. RIGHT BKA. EXAM: LEFT FOOT - 2 VIEW COMPARISON:  None. FINDINGS: Significant soft tissue swelling of the forefoot. Remote segmental resection or trauma of the 5th metatarsal. No acute fracture or subluxation. No radiopaque foreign body or soft tissue gas. Achilles calcaneal spur noted. IMPRESSION: Soft tissue swelling. No evidence for acute osseous abnormality. Electronically Signed   By: Nolon Nations M.D.   On: 03/27/2019 14:26   ECHOCARDIOGRAM COMPLETE  Result Date: 03/28/2019    ECHOCARDIOGRAM REPORT   Patient Name:   Darren West Date of Exam: 03/28/2019 Medical Rec #:  [id number]    Height:       74.0 in Accession #:    [id number]   Weight:       265.2 lb Date of Birth:  1955-03-11     BSA:          2.450 m  Patient Age:    67 years     BP:           154/101 mmHg Patient Gender: M            HR:           91 bpm. Exam Location:  [facility] Procedure: 2D Echo, Cardiac Doppler and Color Doppler Indications:    Atrial Fibrillation 427.31 / I48.91  History:        Patient has no prior history of Echocardiogram examinations.                 Risk Factors:Hypertension and Diabetes. Schizophrenia ([place])                 (From Hx).  Sonographer:    Alvino Chapel RCS Referring Phys: [phone] Leanne Chang [facility]  Sonographer Comments: Somewhat difficult due to patient not cooperating and combative at times. IMPRESSIONS  1. Left ventricular ejection fraction, by estimation, is 60 to 65%. The left ventricle has normal function. The left ventricle has no regional wall motion abnormalities. Left ventricular diastolic parameters are consistent with Grade I diastolic dysfunction (impaired relaxation).  2. Right ventricular systolic function is normal. The right ventricular size is normal.  3. The mitral valve is normal in structure. No evidence of mitral valve regurgitation. No evidence of mitral stenosis.  4. The aortic valve is tricuspid. Aortic valve regurgitation is not visualized. Mild to moderate aortic valve sclerosis/calcification is present, without any evidence of aortic stenosis.  5. The inferior vena cava is normal in size with greater than 50% respiratory variability, suggesting right atrial pressure of 3 mmHg. FINDINGS  Left Ventricle: Left ventricular ejection fraction, by estimation, is 60 to 65%. The left ventricle has normal function. The left ventricle has no regional wall motion abnormalities. The left ventricular internal cavity size was  normal in size. There is  no left ventricular hypertrophy. Left ventricular diastolic parameters are consistent with Grade I diastolic dysfunction (impaired relaxation). Right Ventricle: The right ventricular size is normal. No increase in right ventricular wall thickness. Right  ventricular systolic function is normal. Left Atrium: Left atrial size was normal in size. Right Atrium: Right atrial size was normal in size. Pericardium: There is no evidence of pericardial effusion. Mitral Valve: The mitral valve is normal in structure. Normal mobility of the mitral valve leaflets. No evidence of mitral valve regurgitation. No evidence of mitral valve stenosis. Tricuspid Valve: The tricuspid valve is normal in structure. Tricuspid valve regurgitation is not demonstrated. No evidence of tricuspid stenosis. Aortic Valve: The aortic valve is tricuspid. . There is moderate thickening and moderate calcification of the aortic valve. Aortic valve regurgitation is not visualized. Mild to moderate aortic valve sclerosis/calcification is present, without any evidence of aortic stenosis. There is moderate thickening of the aortic valve. There is moderate calcification of the aortic valve. Pulmonic Valve: The pulmonic valve was normal in structure. Pulmonic valve regurgitation is not visualized. No evidence of pulmonic stenosis. Aorta: The aortic root is normal in size and structure. Venous: The inferior vena cava is normal in size with greater than 50% respiratory variability, suggesting right atrial pressure of 3 mmHg. IAS/Shunts: No atrial level shunt detected by color flow Doppler.  LEFT VENTRICLE PLAX 2D LVIDd:         5.26 cm  Diastology LVIDs:         3.22 cm  LV e' lateral:   10.20 cm/s LV PW:         1.12 cm  LV E/e' lateral: 11.4 LV IVS:        1.18 cm  LV e' medial:    7.07 cm/s LVOT diam:     1.90 cm  LV E/e' medial:  16.4 LV SV:         57 LV SV Index:   23 LVOT Area:     2.84 cm  RIGHT VENTRICLE RV Mid diam:    2.65 cm RV S prime:     10.40 cm/s TAPSE (M-mode): 2.2 cm LEFT ATRIUM             Index LA diam:        3.55 cm 1.45 cm/m LA Vol (A2C):   59.7 ml 24.36 ml/m LA Vol (A4C):   70.5 ml 28.77 ml/m LA Biplane Vol: 68.9 ml 28.12 ml/m  AORTIC VALVE LVOT Vmax:   95.30 cm/s LVOT Vmean:   70.800 cm/s LVOT VTI:    0.201 m  AORTA Ao Root diam: 3.15 cm MITRAL VALVE MV Area (PHT): 3.17 cm     SHUNTS MV Decel Time: 239 msec     Systemic VTI:  0.20 m MV E velocity: 116.00 cm/s  Systemic Diam: 1.90 cm MV A velocity: 109.00 cm/s MV E/A ratio:  1.06 Donato SchultzMark Skains MD Electronically signed by Donato SchultzMark Skains MD Signature Date/Time: 03/28/2019/12:54:20 PM    Final    US EKG SITE RITE  Result Date: 03/28/2019 If Site Rite image not attached, placement could not be confirmed due to current cardiac rhythm.   Scheduled Meds: . carvedilol  12.5 mg Oral Q12H  . Chlorhexidine Gluconate Cloth  6 each Topical Q0600  . collagenase   Topical Daily  . DULoxetine  30 mg Oral Daily  . enoxaparin (LOVENOX) injection  0.5 mg/kg Subcutaneous Q24H  . finasteride  5 mg Oral Daily  .  gabapentin  200 mg Oral TID  . insulin aspart  0-15 Units Subcutaneous TID WC  . insulin aspart  0-5 Units Subcutaneous QHS  . insulin detemir  15 Units Subcutaneous BID  . tamsulosin  0.4 mg Oral QPC supper   Continuous Infusions: . 0.45 % NaCl with KCl 20 mEq / L Stopped (03/28/19 1656)  . ceFEPime (MAXIPIME) IV 2 g (03/29/19 1304)  . metronidazole Stopped (03/29/19 0962)  . vancomycin Stopped (03/29/19 0255)     LOS: 2 days    Time spent: 35 minutes.  Greater than 50% of this time was spent in direct contact with the patient, coordinating care and discussing at length relevant ongoing clinical issues, including extensive discussion about uncontrolled diabetes, left lower extremity with diabetic foot ulcer and concern for necrotic tissues that will require debridement along with need for medication compliance to further control his diabetes.  Patient is off insulin drip and at this moment having adjustments on his insulin therapy to better control CBGs fluctuation.  He continues to be on respiratory antibiotics and will follow recommendation by general surgery regarding surgical debridement as recommended by wound care  services.   Vassie Loll, MD [facility] Pager [phone]   03/29/2019, 1:36 PM

## 2019-03-29 NOTE — BH Assessment (Signed)
[facility] Assessment Progress Note   RN Herbert Seta said that they had trouble finding cart #2.  Cart #1 is going to be utilized.  Herbert Seta said that they would call when they had the cart available.

## 2019-03-30 LAB — BASIC METABOLIC PANEL
Anion gap: 7 (ref 5–15)
BUN: 13 mg/dL (ref 8–23)
CO2: 31 mmol/L (ref 22–32)
Calcium: 8.8 mg/dL — ABNORMAL LOW (ref 8.9–10.3)
Chloride: 104 mmol/L (ref 98–111)
Creatinine, Ser: 0.78 mg/dL (ref 0.61–1.24)
GFR calc Af Amer: >60 mL/min (ref >60)
GFR calc non Af Amer: >60 mL/min (ref >60)
Glucose, Bld: 245 mg/dL — ABNORMAL HIGH (ref 70–99)
Potassium: 4.2 mmol/L (ref 3.5–5.1)
Sodium: 142 mmol/L (ref 135–145)

## 2019-03-30 LAB — GLUCOSE, CAPILLARY
Glucose-Capillary: 225 mg/dL — ABNORMAL HIGH (ref 70–99)
Glucose-Capillary: 229 mg/dL — ABNORMAL HIGH (ref 70–99)
Glucose-Capillary: 308 mg/dL — ABNORMAL HIGH (ref 70–99)
Glucose-Capillary: 235 mg/dL — ABNORMAL HIGH (ref 70–99)
Glucose-Capillary: 275 mg/dL — ABNORMAL HIGH (ref 70–99)

## 2019-03-30 MED ORDER — INSULIN DETEMIR 100 UNIT/ML ~~LOC~~ SOLN
18.0000 [IU] | Freq: Two times a day (BID) | SUBCUTANEOUS | Status: DC
  Administered 2019-03-30 (×2): 18 [IU] via SUBCUTANEOUS
  Filled 2019-03-30 (×4): qty 0.18

## 2019-03-30 MED ORDER — INSULIN ASPART 100 UNIT/ML ~~LOC~~ SOLN
8.0000 [IU] | Freq: Three times a day (TID) | SUBCUTANEOUS | Status: DC
  Administered 2019-03-30 – 2019-03-31 (×3): 8 [IU] via SUBCUTANEOUS
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
## 2019-03-30 NOTE — Progress Notes (Signed)
Subjective: Patient was alert and able to answer questions appropriately. 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 He has not experienced any fevers, chills, lightheadedness, nausea, or vomiting.  Objective: Vital signs in last 24 hours: Temp:  [97.7 F (36.5 C)-98.3 F (36.8 C)] 98.3 F (36.8 C) (03/23 0508) Pulse Rate:  [72-114] 106 (03/23 0508) Resp:  [16-28] 16 (03/23 0508) BP: (148-173)/(85-107) 161/92 (03/23 0508) SpO2:  [92 %-98 %] 95 % (03/23 0900) Last BM Date: 03/29/19  Intake/Output from previous day: 03/22 0701 - 03/23 0700 In: 2777.7 [P.O.:1100; I.V.:724.4; IV Piggyback:953.3] Out: 5700 [Urine:5700] Intake/Output this shift: No intake/output data recorded.  General appearance: alert, cooperative and no distress Head: Normocephalic, without obvious abnormality, atraumatic Resp: clear to auscultation bilaterally and normal respiratory effort. Cardio: regular rate and rhythm, S1, S2 normal, no murmur, click, rub or gallop. Left posterior tibial and femoral pulses palpable. GI: soft, non-tender; bowel sounds normal; no masses,  no organomegaly Skin: Ulceration on ventral, plantar aspect of left foot, no erythema or drainage. MSK: Right below the knee amputation. Psych: Mood and affect normal.  Lab Results:  Recent Labs    03/27/19 1410 03/28/19 0318  WBC 8.1 8.3  HGB 16.3 13.2  HCT 50.2 40.6  PLT 263 203   BMET Recent Labs    03/28/19 0318 03/30/19 0431  NA 147* 142  K 3.4* 4.2  CL 114* 104  CO2 26 31  GLUCOSE 212* 245*  BUN 18 13  CREATININE 0.90 0.78  CALCIUM 8.3* 8.8*   PT/INR Recent Labs    03/27/19 1410  LABPROT 14.1  INR 1.1    Studies/Results: US ARTERIAL SEG MULTIPLE LE (ABI, SEGMENTAL PRESSURES, PVR'S)  Result Date: 03/29/2019 CLINICAL DATA:  History of right BKA. Left foot redness with a wound. EXAM:  NONINVASIVE PHYSIOLOGIC VASCULAR STUDY OF BILATERAL LOWER EXTREMITIES TECHNIQUE: Non-invasive vascular evaluation of both lower extremities was performed at rest, including calculation of ankle-brachial indices, multiple segmental pressure evaluation, segmental Doppler and segmental pulse volume recording. COMPARISON:  None. FINDINGS: Right Lower Extremity Resting ABI:  Below the knee amputation Normal triphasic waveforms in the right thigh. Left Lower Extremity: Resting ABI: 1.03 Resting TBI: Not obtained Segmental Pressures: Normal segmental pressures, no significant (20 mmHg) pressure gradient between adjacent segments. Arterial Waveforms: Waveforms at the left common femoral artery are slightly irregular. Biphasic waveforms at the left SFA. Triphasic waveforms at the left popliteal artery. Triphasic waveforms at the left posterior tibial artery. Monophasic waveforms at the left dorsalis pedis artery. PVRs: Normal PVRs with maintained waveform amplitude and quality. Ankle Brachial index > 1.4 Non diagnostic secondary to incompressible vessel calcifications 1.0-1.4       Normal 0.9-0.99     Borderline PAD 0.8-0.89     Mild PAD 0.5-0.79     Moderate PAD < 0.5          Severe PAD IMPRESSION: Normal left ankle-brachial index, measuring 1.03. Left lower extremity waveforms are normal with exception of the left dorsalis pedis artery. Electronically Signed   By: Richarda Overlie M.D.   On: 03/29/2019 12:57   DG Chest Port 1 View  Result Date: 03/28/2019 CLINICAL DATA:  PICC line placement EXAM: PORTABLE CHEST 1 VIEW COMPARISON:  March 27, 2019 FINDINGS: The right-sided PICC line terminates over the SVC. The heart size is stable from prior study. There is some atelectasis at the lung  bases without evidence for large focal infiltrate. There is no pneumothorax. No large pleural effusion. IMPRESSION: Well position right-sided PICC line. Otherwise stable appearance of the chest. Electronically Signed   By: Katherine Mantle  M.D.   On: 03/28/2019 21:05   ECHOCARDIOGRAM COMPLETE  Result Date: 03/28/2019    ECHOCARDIOGRAM REPORT   Patient Name:   Darren West Date of Exam: 03/28/2019 Medical Rec #:  [id number]    Height:       74.0 in Accession #:    [id number]   Weight:       265.2 lb Date of Birth:  08-Jun-1955     BSA:          2.450 m Patient Age:    63 years     BP:           154/101 mmHg Patient Gender: M            HR:           91 bpm. [facility] Procedure: 2D Echo, Cardiac Doppler and Color Doppler Indications:    Atrial Fibrillation 427.31 / I48.91  History:        Patient has no prior history of Echocardiogram examinations.                 Risk Factors:Hypertension and Diabetes. Schizophrenia (HCC)                 (From Hx).  Sonographer:    Celesta Gentile RCS Referring Phys: [phone] Heloise Beecham [facility]  Sonographer Comments: Somewhat difficult due to patient not cooperating and combative at times. IMPRESSIONS  1. Left ventricular ejection fraction, by estimation, is 60 to 65%. The left ventricle has normal function. The left ventricle has no regional wall motion abnormalities. Left ventricular diastolic parameters are consistent with Grade I diastolic dysfunction (impaired relaxation).  2. Right ventricular systolic function is normal. The right ventricular size is normal.  3. The mitral valve is normal in structure. No evidence of mitral valve regurgitation. No evidence of mitral stenosis.  4. The aortic valve is tricuspid. Aortic valve regurgitation is not visualized. Mild to moderate aortic valve sclerosis/calcification is present, without any evidence of aortic stenosis.  5. The inferior vena cava is normal in size with greater than 50% respiratory variability, suggesting right atrial pressure of 3 mmHg. FINDINGS  Left Ventricle: Left ventricular ejection fraction, by estimation, is 60 to 65%. The left ventricle has normal function. The left ventricle has no regional wall motion abnormalities. The left  ventricular internal cavity size was normal in size. There is  no left ventricular hypertrophy. Left ventricular diastolic parameters are consistent with Grade I diastolic dysfunction (impaired relaxation). Right Ventricle: The right ventricular size is normal. No increase in right ventricular wall thickness. Right ventricular systolic function is normal. Left Atrium: Left atrial size was normal in size. Right Atrium: Right atrial size was normal in size. Pericardium: There is no evidence of pericardial effusion. Mitral Valve: The mitral valve is normal in structure. Normal mobility of the mitral valve leaflets. No evidence of mitral valve regurgitation. No evidence of mitral valve stenosis. Tricuspid Valve: The tricuspid valve is normal in structure. Tricuspid valve regurgitation is not demonstrated. No evidence of tricuspid stenosis. Aortic Valve: The aortic valve is tricuspid. . There is moderate thickening and moderate calcification of the aortic valve. Aortic valve regurgitation is not visualized. Mild to moderate aortic valve sclerosis/calcification is present, without any evidence of aortic stenosis. There  is moderate thickening of the aortic valve. There is moderate calcification of the aortic valve. Pulmonic Valve: The pulmonic valve was normal in structure. Pulmonic valve regurgitation is not visualized. No evidence of pulmonic stenosis. Aorta: The aortic root is normal in size and structure. Venous: The inferior vena cava is normal in size with greater than 50% respiratory variability, suggesting right atrial pressure of 3 mmHg. IAS/Shunts: No atrial level shunt detected by color flow Doppler.  LEFT VENTRICLE PLAX 2D LVIDd:         5.26 cm  Diastology LVIDs:         3.22 cm  LV e' lateral:   10.20 cm/s LV PW:         1.12 cm  LV E/e' lateral: 11.4 LV IVS:        1.18 cm  LV e' medial:    7.07 cm/s LVOT diam:     1.90 cm  LV E/e' medial:  16.4 LV SV:         57 LV SV Index:   23 LVOT Area:     2.84 cm   RIGHT VENTRICLE RV Mid diam:    2.65 cm RV S prime:     10.40 cm/s TAPSE (M-mode): 2.2 cm LEFT ATRIUM             Index LA diam:        3.55 cm 1.45 cm/m LA Vol (A2C):   59.7 ml 24.36 ml/m LA Vol (A4C):   70.5 ml 28.77 ml/m LA Biplane Vol: 68.9 ml 28.12 ml/m  AORTIC VALVE LVOT Vmax:   95.30 cm/s LVOT Vmean:  70.800 cm/s LVOT VTI:    0.201 m  AORTA Ao Root diam: 3.15 cm MITRAL VALVE MV Area (PHT): 3.17 cm     SHUNTS MV Decel Time: 239 msec     Systemic VTI:  0.20 m MV E velocity: 116.00 cm/s  Systemic Diam: 1.90 cm MV A velocity: 109.00 cm/s MV E/A ratio:  1.06 Candee Furbish MD Electronically signed by Candee Furbish MD Signature Date/Time: 03/28/2019/12:54:20 PM    Final    [place] EKG SITE RITE  Result Date: 03/28/2019 If Site Rite image not attached, placement could not be confirmed due to current cardiac rhythm.   Anti-infectives: Anti-infectives (From admission, onward)   Start     Dose/Rate Route Frequency Ordered Stop   03/28/19 0200  vancomycin (VANCOCIN) IVPB 1000 mg/200 mL premix     1,000 mg 200 mL/hr over 60 Minutes Intravenous Every 12 hours 03/27/19 1348     03/27/19 2300  metroNIDAZOLE (FLAGYL) IVPB 500 mg     500 mg 100 mL/hr over 60 Minutes Intravenous Every 8 hours 03/27/19 1639     03/27/19 2000  ceFEPIme (MAXIPIME) 2 g in sodium chloride 0.9 % 100 mL IVPB     2 g 200 mL/hr over 30 Minutes Intravenous Every 8 hours 03/27/19 1348     03/27/19 1346  vancomycin (VANCOREADY) IVPB 2000 mg/400 mL     2,000 mg 200 mL/hr over 120 Minutes Intravenous  Once 03/27/19 1346 03/27/19 1718   03/27/19 1345  ceFEPIme (MAXIPIME) 2 g in sodium chloride 0.9 % 100 mL IVPB     2 g 200 mL/hr over 30 Minutes Intravenous  Once 03/27/19 1341 03/27/19 1512   03/27/19 1345  metroNIDAZOLE (FLAGYL) IVPB 500 mg     500 mg 100 mL/hr over 60 Minutes Intravenous  Once 03/27/19 1341 03/27/19 1621   03/27/19 1345  vancomycin (VANCOCIN)  IVPB 1000 mg/200 mL premix  Status:  Discontinued     1,000 mg 200 mL/hr  over 60 Minutes Intravenous  Once 03/27/19 1341 03/27/19 1346      Assessment/Plan:  Interpretation: Patients ulceration appears to be improving following initiation of santyl and wet-to-dry wound dressings. Imaging indicated peripheral blood flow within normal limits.  Plan: Continue santyl and wet-to-dry wound dressings. Continue antibiotic coverage. Continue glycemic control. Will see patient tomorrow to observe wound progression.   LOS: 3 days    Eliezer Lofts 03/30/2019
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
## 2019-03-30 NOTE — Progress Notes (Signed)
PROGRESS NOTE    Darren West  ZOX:[name] DOB: 10/07/1955 DOA: 03/27/2019 PCP: Patient, No Pcp Per     Brief Narrative:  As per H&P written by Dr. Mariea West on 03/27/2019  64 y.o. male with medical history significant for diabetes mellitus, hypertension, schizophrenia current suicidal ideations, right below-knee amputation. Patient was recently discharged from a behavioural health facility 3/12-3/15 after hospitalization for suicidal ideations.  He was discharged to a hotel because he was homeless and was able to stay about 3 days.  Also called EMS because patient had slid out of w/c , and was repeating his nephew's name continuously when he was questioned. On my evaluation, patient is lethargic, answers yes or no, I am unsure if this answers are accurate.  Still confused and not able to give me details.  He denies cough or difficulty breathing, he denies chest pain, he points to his left lower abdomen when asked if he has pain.  He is unable to tell me if he has been compliant with his medications.  ED Course: Temperature 98.6.  Tachycardic to 112, blood pressure systolic 150s to [id number].  Blood glucose elevated at 733, normal anion gap 13, serum bicarb 29.  Lactic acid elevated 2.5, normal WBC 8.1.  Chest x-ray showed left lower lobe infiltrate.  X-ray of the left foot showed soft tissue swelling abnormality.  EKG showed atrial fibrillation rate 103-no prior EKGs.  Patient was started on IV vancomycin and cefepime for HCAP.  Patient was also started on insulin drip.   Assessment & Plan: 1-honk: Uncontrolled type 2 diabetes with hyperglycemia  -elevated A1c of 11.3 demonstrating poor control at baseline. -Continue sliding scale insulin, modified carbohydrate diet and adjusted dose of long-acting insulin along with NovoLog for meal coverage. -Follow CBGs and further adjust regimen as needed. -Continue holding oral hypoglycemic agents while inpatient. -Medication noncompliance is a leading factor  for patient uncontrolled sugar.  2-transient atrial fibrillation in the setting of hyperglycemia, HCAP and rebound from absent baseline beta-blocker usage. -Continue metoprolol -Patient back to sinus rhythm and has remained stable.  Will discontinue telemetry. -Normal TSH appreciated -No long term anticoagulation anticipated secondary to poor compliance and transient event.  3-HCAP/left lower extremity cellulitis From diabetic ulcer. -Follow wound care services recommendations -Continue current antibiotics -Patient is afebrile and with normal WBC; sepsis features present on admission essentially resolved. -Follow clinical response. -Lower extremity arterial Dopplers demonstrated intact vasculature; general surgery recommending no surgical debridement currently, recommendations given to continue the use of Santyl, wet to dry dressing changes And current  Antibiotics.  4-elevated blood pressure -Continue home beta-blocker, Flomax and Proscar -Blood pressure stable.  5-BPH -R continue Flomax and Proscar -Patient reports no urinary retention symptoms.  6-status post right BKA -No open wounds appreciated on his stump -Continue to provide assistance and supportive care as needed.  7-class I obesity -Body mass index is 34.05 kg/m. -Low calorie diet, portion control and increase physical activity discussed with patient.  8-diabetic neuropathy -Continue Neurontin and as needed use of baclofen  9-depression -Continue Cymbalta -No hallucinations or suicidal ideation currently -Mood overall stable -Been cleared by TTS; will discontinue sitter.   DVT prophylaxis: Lovenox Code Status: Full code Family Communication: No family at bedside. Disposition Plan: Remains in the hospital; continue adjusting insulin therapy based on his sugar levels, follow cultures results and continue current IV antibiotics.  Follow wound care service and general surgery recommendations.  Has been cleared by  psychiatry service.  Physical therapy recommending skilled nursing facility  at discharge.  Clinical social worker aware and on board to assist with discharge plans.  Consultants:   Wound care service  General surgery  TTS (psychiatry service).  Procedures:   See below for x-ray reports.  Antimicrobials:  Anti-infectives (From admission, onward)   Start     Dose/Rate Route Frequency Ordered Stop   03/28/19 0200  vancomycin (VANCOCIN) IVPB 1000 mg/200 mL premix     1,000 mg 200 mL/hr over 60 Minutes Intravenous Every 12 hours 03/27/19 1348     03/27/19 2300  metroNIDAZOLE (FLAGYL) IVPB 500 mg     500 mg 100 mL/hr over 60 Minutes Intravenous Every 8 hours 03/27/19 1639     03/27/19 2000  ceFEPIme (MAXIPIME) 2 g in sodium chloride 0.9 % 100 mL IVPB     2 g 200 mL/hr over 30 Minutes Intravenous Every 8 hours 03/27/19 1348     03/27/19 1346  vancomycin (VANCOREADY) IVPB 2000 mg/400 mL     2,000 mg 200 mL/hr over 120 Minutes Intravenous  Once 03/27/19 1346 03/27/19 1718   03/27/19 1345  ceFEPIme (MAXIPIME) 2 g in sodium chloride 0.9 % 100 mL IVPB     2 g 200 mL/hr over 30 Minutes Intravenous  Once 03/27/19 1341 03/27/19 1512   03/27/19 1345  metroNIDAZOLE (FLAGYL) IVPB 500 mg     500 mg 100 mL/hr over 60 Minutes Intravenous  Once 03/27/19 1341 03/27/19 1621   03/27/19 1345  vancomycin (VANCOCIN) IVPB 1000 mg/200 mL premix  Status:  Discontinued     1,000 mg 200 mL/hr over 60 Minutes Intravenous  Once 03/27/19 1341 03/27/19 1346       Subjective: Overnight patient expressed to nursing staff suicidal thoughts and significant overall frustration; no fever, no chest pain, no nausea, no vomiting.  CBGs continue fluctuating.  TTS consulted and has cleared patient from a psychiatry standpoint.  Objective: Vitals:   03/30/19 0208 03/30/19 0508 03/30/19 0900 03/30/19 1403  BP: (!) 148/85 (!) 161/92  (!) 144/94  Pulse: 78 (!) 106  82  Resp:  16  (!) 22  Temp:  98.3 F (36.8 C)   98.5 F (36.9 C)  TempSrc:  Oral  Oral  SpO2:   95% 96%  Weight:      Height:        Intake/Output Summary (Last 24 hours) at 03/30/2019 1641 Last data filed at 03/30/2019 1317 Gross per 24 hour  Intake 1164.63 ml  Output 4600 ml  Net -3435.37 ml   Filed Weights   03/27/19 1247 03/27/19 1700 03/28/19 0630  Weight: 120.7 kg 117.8 kg 120.3 kg    Examination: General exam: Alert, awake, and cooperative with examination.  Continue to reports intermittent episodes of pain in his shoulders and also neuropathic pain.  No nausea, no vomiting, no shortness of breath.  Patient denies suicidal ideation currently.  No fever. Respiratory system: Clear to auscultation. Respiratory effort normal. Cardiovascular system:RRR. No murmurs, rubs, gallops. Gastrointestinal system: Abdomen is nondistended, soft and nontender. No organomegaly or masses felt. Normal bowel sounds heard. Central nervous system: Alert and oriented. No focal neurological deficits. Extremities/skin: No cyanosis or clubbing; continue to demonstrate significant  improvement in erythematous changes, left foot sole ulcer with some darkness and some central necrosis, but no significant drainage or odor. Better overall.  Right BKA. Psychiatry: Mood & affect appropriate.    Data Reviewed: I have personally reviewed following labs and imaging studies  CBC: Recent Labs  Lab 03/27/19 1410 03/28/19 0318  WBC 8.1 8.3  NEUTROABS 6.3  --   HGB 16.3 13.2  HCT 50.2 40.6  MCV 90.5 89.2  PLT 263 229   Basic Metabolic Panel: Recent Labs  Lab 03/27/19 1410 03/27/19 1910 03/28/19 0318 03/30/19 0431  NA 146* 149* 147* 142  K 3.9 3.3* 3.4* 4.2  CL 104 112* 114* 104  CO2 29 26 26 31   GLUCOSE 733* 459* 212* 245*  BUN 27* 23 18 13   CREATININE 1.17 1.10 0.90 0.78  CALCIUM 9.9 8.9 8.3* 8.8*  MG 2.2  --   --   --    GFR: Estimated Creatinine Clearance: 128.5 mL/min (by C-G formula based on SCr of 0.78 mg/dL).   Liver Function  Tests: Recent Labs  Lab 03/27/19 1410  AST 12*  ALT 12  ALKPHOS 172*  BILITOT 0.7  PROT 8.9*  ALBUMIN 3.9   Coagulation Profile: Recent Labs  Lab 03/27/19 1410  INR 1.1   CBG: Recent Labs  Lab 03/29/19 2107 03/30/19 0607 03/30/19 0729 03/30/19 1139 03/30/19 1622  GLUCAP 266* 225* 229* 308* 235*   Urine analysis:    Component Value Date/Time   COLORURINE STRAW (A) 03/27/2019 1341   APPEARANCEUR CLEAR 03/27/2019 1341   LABSPEC 1.031 (H) 03/27/2019 1341   PHURINE 6.0 03/27/2019 1341   GLUCOSEU >=500 (A) 03/27/2019 1341   HGBUR SMALL (A) 03/27/2019 1341   BILIRUBINUR NEGATIVE 03/27/2019 1341   KETONESUR 5 (A) 03/27/2019 1341   PROTEINUR 100 (A) 03/27/2019 1341   NITRITE NEGATIVE 03/27/2019 1341   LEUKOCYTESUR NEGATIVE 03/27/2019 1341    Recent Results (from the past 240 hour(s))  Urine culture     Status: Abnormal   Collection Time: 03/27/19  1:41 PM   Specimen: In/Out Cath Urine  Result Value Ref Range Status   Specimen Description   Final    IN/OUT CATH URINE Performed at [facility], [address], [place], [place] [address]    Special Requests   Final    NONE Performed at [facility], [address], [place], [place] [address]    Culture MULTIPLE SPECIES PRESENT, SUGGEST RECOLLECTION (A)  Final   Report Status 03/29/2019 FINAL  Final  Blood Culture (routine x 2)     Status: None (Preliminary result)   Collection Time: 03/27/19  1:46 PM   Specimen: BLOOD RIGHT HAND  Result Value Ref Range Status   Specimen Description   Final    BLOOD RIGHT HAND BOTTLES DRAWN AEROBIC AND ANAEROBIC   Special Requests   Final    Blood Culture results may not be optimal due to an inadequate volume of blood received in culture bottles   Culture   Final    NO GROWTH 3 DAYS Performed at [facility], [address] [place], [place] [address]    Report Status PENDING  Incomplete  Blood Culture (routine x 2)     Status: None (Preliminary result)   Collection Time:  03/27/19  2:23 PM   Specimen: BLOOD RIGHT WRIST  Result Value Ref Range Status   Specimen Description   Final    BLOOD RIGHT WRIST BOTTLES DRAWN AEROBIC AND ANAEROBIC   Special Requests   Final    Blood Culture results may not be optimal due to an inadequate volume of blood received in culture bottles   Culture   Final    NO GROWTH 3 DAYS Performed at [facility], [address], [place], [place] [address]    Report Status PENDING  Incomplete  Respiratory Panel by RT PCR (Flu A&B, Covid) - Nasopharyngeal Swab     Status: None   Collection Time: 03/27/19  3:15 PM   Specimen: Nasopharyngeal Swab  Result Value Ref Range Status   SARS Coronavirus 2 by RT PCR NEGATIVE NEGATIVE Final    Comment: (NOTE) SARS-CoV-2 target nucleic acids are NOT DETECTED. The SARS-CoV-2 RNA is generally detectable in upper respiratoy specimens during the acute phase of infection. The lowest concentration of SARS-CoV-2 viral copies this assay can detect is 131 copies/mL. A negative result does not preclude SARS-Cov-2 infection and should not be used as the sole basis for treatment or other patient management decisions. A negative result may occur with  improper specimen collection/handling, submission of specimen other than nasopharyngeal swab, presence of viral mutation(s) within the areas targeted by this assay, and inadequate number of viral copies (<131 copies/mL). A negative result must be combined with clinical observations, patient history, and epidemiological information. The expected result is Negative. Fact Sheet for Patients:  https://www.moore.com/ Fact Sheet for Healthcare Providers:  https://www.young.biz/ This test is not yet ap proved or cleared by the [place] FDA and  has been authorized for detection and/or diagnosis of SARS-CoV-2 by FDA under an Emergency Use Authorization (EUA). This EUA will remain  in effect (meaning this test can be used)  for the duration of the COVID-19 declaration under Section 564(b)(1) of the Act, 21 U.S.C. section 360bbb-3(b)(1), unless the authorization is terminated or revoked sooner.    Influenza A by PCR NEGATIVE NEGATIVE Final   Influenza B by PCR NEGATIVE NEGATIVE Final    Comment: (NOTE) The Xpert Xpress SARS-CoV-2/FLU/RSV assay is intended as an aid in  the diagnosis of influenza from Nasopharyngeal swab specimens and  should not be used as a sole basis for treatment. Nasal washings and  aspirates are unacceptable for Xpert Xpress SARS-CoV-2/FLU/RSV  testing. Fact Sheet for Patients: https://www.moore.com/ Fact Sheet for Healthcare Providers: https://www.young.biz/ This test is not yet approved or cleared by the [place] FDA and  has been authorized for detection and/or diagnosis of SARS-CoV-2 by  FDA under an Emergency Use Authorization (EUA). This EUA will remain  in effect (meaning this test can be used) for the duration of the  Covid-19 declaration under Section 564(b)(1) of the Act, 21  U.S.C. section 360bbb-3(b)(1), unless the authorization is  terminated or revoked. Performed at [facility], [address], [place], [place] [address]   MRSA PCR Screening     Status: None   Collection Time: 03/27/19  4:57 PM   Specimen: Nasal Mucosa; Nasopharyngeal  Result Value Ref Range Status   MRSA by PCR NEGATIVE NEGATIVE Final    Comment:        The GeneXpert MRSA Assay (FDA approved for NASAL specimens only), is one component of a comprehensive MRSA colonization surveillance program. It is not intended to diagnose MRSA infection nor to guide or monitor treatment for MRSA infections. Performed at [facility], [address] [place], [place] [address]      Radiology Studies: US ARTERIAL SEG MULTIPLE LE (ABI, SEGMENTAL PRESSURES, PVR'S)  Result Date: 03/29/2019 CLINICAL DATA:  History of right BKA. Left foot redness with a wound. EXAM:  NONINVASIVE PHYSIOLOGIC VASCULAR STUDY OF BILATERAL LOWER EXTREMITIES TECHNIQUE: Non-invasive vascular evaluation of both lower extremities was performed at rest, including calculation of ankle-brachial indices, multiple segmental pressure evaluation, segmental Doppler and segmental pulse volume recording. COMPARISON:  None. FINDINGS: Right Lower Extremity Resting ABI:  Below the knee amputation  Normal triphasic waveforms in the right thigh. Left Lower Extremity: Resting ABI: 1.03 Resting TBI: Not obtained Segmental Pressures: Normal segmental pressures, no significant (20 mmHg) pressure gradient between adjacent segments. Arterial Waveforms: Waveforms at the left common femoral artery are slightly irregular. Biphasic waveforms at the left SFA. Triphasic waveforms at the left popliteal artery. Triphasic waveforms at the left posterior tibial artery. Monophasic waveforms at the left dorsalis pedis artery. PVRs: Normal PVRs with maintained waveform amplitude and quality. Ankle Brachial index > 1.4 Non diagnostic secondary to incompressible vessel calcifications 1.0-1.4       Normal 0.9-0.99     Borderline PAD 0.8-0.89     Mild PAD 0.5-0.79     Moderate PAD < 0.5          Severe PAD IMPRESSION: Normal left ankle-brachial index, measuring 1.03. Left lower extremity waveforms are normal with exception of the left dorsalis pedis artery. Electronically Signed   By: Richarda Overlie M.D.   On: 03/29/2019 12:57   DG Chest Port 1 View  Result Date: 03/28/2019 CLINICAL DATA:  PICC line placement EXAM: PORTABLE CHEST 1 VIEW COMPARISON:  March 27, 2019 FINDINGS: The right-sided PICC line terminates over the SVC. The heart size is stable from prior study. There is some atelectasis at the lung bases without evidence for large focal infiltrate. There is no pneumothorax. No large pleural effusion. IMPRESSION: Well position right-sided PICC line. Otherwise stable appearance of the chest. Electronically Signed   By: Katherine Mantle  M.D.   On: 03/28/2019 21:05   [place] EKG SITE RITE  Result Date: 03/28/2019 If Site Rite image not attached, placement could not be confirmed due to current cardiac rhythm.   Scheduled Meds: . carvedilol  12.5 mg Oral Q12H  . Chlorhexidine Gluconate Cloth  6 each Topical Q0600  . collagenase   Topical Daily  . DULoxetine  30 mg Oral Daily  . enoxaparin (LOVENOX) injection  0.5 mg/kg Subcutaneous Q24H  . finasteride  5 mg Oral Daily  . gabapentin  200 mg Oral TID  . insulin aspart  0-15 Units Subcutaneous TID WC  . insulin aspart  0-5 Units Subcutaneous QHS  . insulin aspart  8 Units Subcutaneous TID WC  . insulin detemir  18 Units Subcutaneous BID  . tamsulosin  0.4 mg Oral QPC supper   Continuous Infusions: . ceFEPime (MAXIPIME) IV 2 g (03/30/19 1546)  . metronidazole 500 mg (03/30/19 8333)  . vancomycin 1,000 mg (03/30/19 0200)     LOS: 3 days    Time spent: 35 minutes.   Vassie Loll, MD [facility] Pager [phone]   03/30/2019, 4:41 PM

## 2019-03-30 NOTE — Progress Notes (Signed)
Inpatient Diabetes Program Recommendations  AACE/ADA: New Consensus Statement on Inpatient Glycemic Control (2015)  Target Ranges:  Prepandial:   less than 140 mg/dL      Peak postprandial:   less than 180 mg/dL (1-2 hours)      Critically ill patients:  140 - 180 mg/dL   Lab Results  Component Value Date   GLUCAP 229 (H) 03/30/2019   HGBA1C 12.0 (H) 03/28/2019    Review of Glycemic Control  Diabetes history: DM2 Outpatient Diabetes medications: Lantus 20 units + Novolog 6 units tid meal coverage (D/C'd from [facility] ED on 03/28/19 with D/C prescriptions) Current orders for Inpatient glycemic control: Levemir 15 units bid +  Novolog 6 units tid meal coverage + Novolog moderate correction tid + hs 0-5 units  Inpatient Diabetes Program Recommendations:   -Increase Novolog to 8 units tid if eats 50% -Increase Levemir to 18 units bid  Thank you, Darel Hong E. Nikeya Maxim, RN, MSN, CDE  Diabetes Coordinator Inpatient Glycemic Control Team Team Pager [phone] (8am-5pm) 03/30/2019 9:17 AM

## 2019-03-31 DIAGNOSIS — L03116 Cellulitis of left lower limb: Secondary | ICD-10-CM

## 2019-03-31 DIAGNOSIS — F329 Major depressive disorder, single episode, unspecified: Secondary | ICD-10-CM

## 2019-03-31 DIAGNOSIS — E1165 Type 2 diabetes mellitus with hyperglycemia: Secondary | ICD-10-CM

## 2019-03-31 DIAGNOSIS — N400 Enlarged prostate without lower urinary tract symptoms: Secondary | ICD-10-CM

## 2019-03-31 DIAGNOSIS — Z794 Long term (current) use of insulin: Secondary | ICD-10-CM

## 2019-03-31 DIAGNOSIS — E08621 Diabetes mellitus due to underlying condition with foot ulcer: Secondary | ICD-10-CM

## 2019-03-31 DIAGNOSIS — L97422 Non-pressure chronic ulcer of left heel and midfoot with fat layer exposed: Secondary | ICD-10-CM

## 2019-03-31 LAB — GLUCOSE, CAPILLARY
Glucose-Capillary: 228 mg/dL — ABNORMAL HIGH (ref 70–99)
Glucose-Capillary: 248 mg/dL — ABNORMAL HIGH (ref 70–99)
Glucose-Capillary: 253 mg/dL — ABNORMAL HIGH (ref 70–99)
Glucose-Capillary: 213 mg/dL — ABNORMAL HIGH (ref 70–99)
Glucose-Capillary: 344 mg/dL — ABNORMAL HIGH (ref 70–99)

## 2019-03-31 LAB — VANCOMYCIN, TROUGH: Vancomycin Tr: 16 ug/mL (ref 15–20)

## 2019-03-31 MED ORDER — INSULIN ASPART 100 UNIT/ML ~~LOC~~ SOLN
10.0000 [IU] | Freq: Three times a day (TID) | SUBCUTANEOUS | Status: DC
  Administered 2019-03-31 – 2019-04-01 (×3): 10 [IU] via SUBCUTANEOUS

## 2019-03-31 MED ORDER — INSULIN DETEMIR 100 UNIT/ML ~~LOC~~ SOLN
20.0000 [IU] | Freq: Two times a day (BID) | SUBCUTANEOUS | Status: DC
  Administered 2019-03-31 – 2019-04-01 (×3): 20 [IU] via SUBCUTANEOUS
  Filled 2019-03-31 (×5): qty 0.2

## 2019-03-31 MED ORDER — SODIUM CHLORIDE 0.9 % IV SOLN
1.0000 g | INTRAVENOUS | Status: DC
  Administered 2019-03-31: 1 g via INTRAVENOUS
  Filled 2019-03-31: qty 10

## 2019-03-31 NOTE — TOC Progression Note (Addendum)
Transition of Care [facility]) - Progression Note    Patient Details  Name: Darren West MRN: [id number] Date of Birth: April 01, 1955  Transition [facility] CM/SW Contact  Annice Needy, LCSW Phone Number: 03/31/2019, 3:51 PM  Clinical Narrative:    Patient cannot return to the [facility] per hotel staff because they do not have handicapp accessible rooms. She stated that they do have his belongings waiting for him when he can come or have someone to get them. She hung up on [facility] social worker twice.  Call made to [facility] regarding emergency notification. Notification ID [phone].  Patient offered ALF and [facility] and declined.   Patient may have to go to [facility] upon discharge. Advised patient of this. Patient says that he will get a cab to pick him up at discharge and take him to his friend's house. I asked him to provide his friend's name and patient said "I gotta go, I have another call coming in" and hung up. Patient was on the hospital phone and another call could not have come in.     Expected Discharge Plan: Skilled Nursing Facility Barriers to Discharge: Continued Medical Work up  Expected Discharge Plan and Services Expected Discharge Plan: Skilled Nursing Facility     Post Acute Care Choice: Skilled Nursing Facility Living arrangements for the past 2 months: Hotel/Motel                                       Social Determinants of Health (SDOH) Interventions    Readmission Risk Interventions No flowsheet data found.

## 2019-03-31 NOTE — Progress Notes (Signed)
PROGRESS NOTE  Daymien Goth DTO:[id number] DOB: [birth year] DOA: 03/27/2019 PCP: Patient, No Pcp Per  Brief History:  64 year old male with a history of diabetes mellitus, hypertension, schizophrenia, right BKA presenting from hotel shelter secondary to mental status change.  The patient was recently hospitalized at behavioral health from 03/19/2019 to 03/22/2019 secondary to suicidal ideation.  He was discharged to hotel where he stayed for approximately 3 days.  Apparently, the patient slid out of his wheelchair and since then has been confused and more lethargic.  In the emergency department, the patient had a serum glucose of 733 with bicarbonate 29.  He was treated for a nonketotic hyperosmolar state and started on IV fluids and IV insulin.  He was subsequently transitioned to subcutaneous insulin.  The patient was started on IV antibiotics for presumptive left lower extremity cellulitis/infected diabetic foot ulcer.  The patient's mental status improved.  He remained afebrile and hemodynamically stable.  Assessment/Plan: Nonketotic hyperosmolar state -patient started on IV insulin with q 1 hour CBG check and q 4 hour BMPs -pt started on aggressive fluid resuscitation -Electrolytes were monitored and repleted -transitioned to [place] insulin once anion gap closed -diet was advanced once anion gap closed -HbA1C--12.0  Uncontrolled diabetes mellitus type 2 with hyperglycemia -03/28/2019 hemoglobin A1c 12.0 -Increase Levemir to 20 units twice daily -Increase NovoLog 10 units with meals -Holding metformin  Cellulitis left lower extremity/diabetic foot ulcer -Improved on IV antibiotics -Transition to oral antibiotics -03/29/2019 ABI--LLE--1.03 -03/29/2019 ABI--RLE--triphasic waves at the thigh  Acute metabolic encephalopathy -Secondary to [facility] -Improved -CT brain negative -UA negative for pyuria  Transient atrial fibrillation -The patient had transient episode of atrial  fibrillation -Given acute medical stress and short duration, do not plan to start anticoagulation -Continue carvedilol -03/28/2019 echo EF 60-65%, no WMA, grade 1 DD  Essential hypertension -Continue carvedilol  Pulmonary infiltrate -Personally reviewed chest x-ray--bibasilar atelectasis -Patient is stable on room air without dyspnea  BPH -Continue tamsulosin and finasteride -Patient reports no urinary retention symptoms  Depression -Continue Cymbalta -Patient has been cleared by TTS -No hallucinations or suicidal ideation  Right BKA status         Disposition Plan: Patient From: Hotel--Homeless D/C Place: Unclear presently Barriers: Homelessness  Family Communication:   No Family at bedside  Consultants:  none  Code Status:  FULL  DVT Prophylaxis:   [place] Lovenox   Procedures: As Listed in Progress Note Above  Antibiotics: Vanco 3/20>>> cefepime/flagyl 3/20>>>3/24 Ceftriaxone 3/24>>>      Subjective: Patient denies fevers, chills, headache, chest pain, dyspnea, nausea, vomiting, diarrhea, abdominal pain, dysuria, hematuria, hematochezia, and melena.   Objective: Vitals:   03/30/19 1959 03/30/19 2037 03/31/19 0615 03/31/19 1410  BP:  (!) 139/91 (!) 147/98 (!) 143/84  Pulse:  84 80 80  Resp:  16 16 18   Temp:  98.4 F (36.9 C) 97.8 F (36.6 C) (!) 97.4 F (36.3 C)  TempSrc:  Oral Oral Oral  SpO2: 94% 95% 96% 96%  Weight:      Height:        Intake/Output Summary (Last 24 hours) at 03/31/2019 1642 Last data filed at 03/31/2019 1100 Gross per 24 hour  Intake 240 ml  Output 2600 ml  Net -2360 ml   Weight change:  Exam:   General:  Pt is alert, follows commands appropriately, not in acute distress  HEENT: No icterus, No thrush, No neck mass, [place]/AT  Cardiovascular: RRR, S1/S2, no rubs, no  gallops  Respiratory: bibasilar crackles, no wheeze  Abdomen: Soft/+BS, non tender, non distended, no guarding  Extremities: trace LE edema, No  lymphangitis, No petechiae, No rashes, no synovitis   Data Reviewed: I have personally reviewed following labs and imaging studies Basic Metabolic Panel: Recent Labs  Lab 03/27/19 1410 03/27/19 1910 03/28/19 0318 03/30/19 0431  NA 146* 149* 147* 142  K 3.9 3.3* 3.4* 4.2  CL 104 112* 114* 104  CO2 GLUCOSE 733* 459* 212* 245*  BUN 27* CREATININE 1.17 1.10 0.90 0.78  CALCIUM 9.9 8.9 8.3* 8.8*  MG 2.2  --   --   --    Liver Function Tests: Recent Labs  Lab 03/27/19 1410  AST 12*  ALT 12  ALKPHOS 172*  BILITOT 0.7  PROT 8.9*  ALBUMIN 3.9   No results for input(s): LIPASE, AMYLASE in the last 168 hours. No results for input(s): AMMONIA in the last 168 hours. Coagulation Profile: Recent Labs  Lab 03/27/19 1410  INR 1.1   CBC: Recent Labs  Lab 03/27/19 1410 03/28/19 0318  WBC 8.1 8.3  NEUTROABS 6.3  --   HGB 16.3 13.2  HCT 50.2 40.6  MCV 90.5 89.2  PLT 263 203   Cardiac Enzymes: No results for input(s): CKTOTAL, CKMB, CKMBINDEX, TROPONINI in the last 168 hours. BNP: Invalid input(s): POCBNP CBG: Recent Labs  Lab 03/30/19 1945 03/31/19 0652 03/31/19 0747 03/31/19 1126 03/31/19 1607  GLUCAP 275* 228* 248* 253* 213*   HbA1C: No results for input(s): HGBA1C in the last 72 hours. Urine analysis:    Component Value Date/Time   COLORURINE STRAW (A) 03/27/2019 1341   APPEARANCEUR CLEAR 03/27/2019 1341   LABSPEC 1.031 (H) 03/27/2019 1341   PHURINE 6.0 03/27/2019 1341   GLUCOSEU >=500 (A) 03/27/2019 1341   HGBUR SMALL (A) 03/27/2019 1341   BILIRUBINUR NEGATIVE 03/27/2019 1341   KETONESUR 5 (A) 03/27/2019 1341   PROTEINUR 100 (A) 03/27/2019 1341   NITRITE NEGATIVE 03/27/2019 1341   LEUKOCYTESUR NEGATIVE 03/27/2019 1341   Sepsis Labs: (procalcitonin:4,lacticidven:4) ) Recent Results (from the past 240 hour(s))  Urine culture     Status: Abnormal   Collection Time: 03/27/19  1:41 PM   Specimen: In/Out Cath Urine   Result Value Ref Range Status   Specimen Description   Final    IN/OUT CATH URINE Performed at [facility], [address], [place], [place] [address]    Special Requests   Final    NONE Performed at [facility], [address] [place], [place] [address]    Culture MULTIPLE SPECIES PRESENT, SUGGEST RECOLLECTION (A)  Final   Report Status 03/29/2019 FINAL  Final  Blood Culture (routine x 2)     Status: None (Preliminary result)   Collection Time: 03/27/19  1:46 PM   Specimen: BLOOD RIGHT HAND  Result Value Ref Range Status   Specimen Description   Final    BLOOD RIGHT HAND BOTTLES DRAWN AEROBIC AND ANAEROBIC   Special Requests   Final    Blood Culture results may not be optimal due to an inadequate volume of blood received in culture bottles   Culture   Final    NO GROWTH 4 DAYS Performed at [facility], [address], [place], [place] [address]    Report Status PENDING  Incomplete  Blood Culture (routine x 2)     Status: None (Preliminary result)   Collection Time: 03/27/19  2:23 PM  Specimen: BLOOD RIGHT WRIST  Result Value Ref Range Status   Specimen Description   Final    BLOOD RIGHT WRIST BOTTLES DRAWN AEROBIC AND ANAEROBIC   Special Requests   Final    Blood Culture results may not be optimal due to an inadequate volume of blood received in culture bottles   Culture   Final    NO GROWTH 4 DAYS Performed at [facility], [address] [place], [place] [address]    Report Status PENDING  Incomplete  Respiratory Panel by RT PCR (Flu A&B, Covid) - Nasopharyngeal Swab     Status: None   Collection Time: 03/27/19  3:15 PM   Specimen: Nasopharyngeal Swab  Result Value Ref Range Status   SARS Coronavirus 2 by RT PCR NEGATIVE NEGATIVE Final    Comment: (NOTE) SARS-CoV-2 target nucleic acids are NOT DETECTED. The SARS-CoV-2 RNA is generally detectable in upper respiratoy specimens during the acute phase of infection. The lowest concentration of SARS-CoV-2 viral copies  this assay can detect is 131 copies/mL. A negative result does not preclude SARS-Cov-2 infection and should not be used as the sole basis for treatment or other patient management decisions. A negative result may occur with  improper specimen collection/handling, submission of specimen other than nasopharyngeal swab, presence of viral mutation(s) within the areas targeted by this assay, and inadequate number of viral copies (<131 copies/mL). A negative result must be combined with clinical observations, patient history, and epidemiological information. The expected result is Negative. Fact Sheet for Patients:  PinkCheek.be Fact Sheet for Healthcare Providers:  GravelBags.it This test is not yet ap proved or cleared by the [place] FDA and  has been authorized for detection and/or diagnosis of SARS-CoV-2 by FDA under an Emergency Use Authorization (EUA). This EUA will remain  in effect (meaning this test can be used) for the duration of the COVID-19 declaration under Section 564(b)(1) of the Act, 21 U.S.C. section 360bbb-3(b)(1), unless the authorization is terminated or revoked sooner.    Influenza A by PCR NEGATIVE NEGATIVE Final   Influenza B by PCR NEGATIVE NEGATIVE Final    Comment: (NOTE) The Xpert Xpress SARS-CoV-2/FLU/RSV assay is intended as an aid in  the diagnosis of influenza from Nasopharyngeal swab specimens and  should not be used as a sole basis for treatment. Nasal washings and  aspirates are unacceptable for Xpert Xpress SARS-CoV-2/FLU/RSV  testing. Fact Sheet for Patients: PinkCheek.be Fact Sheet for Healthcare Providers: GravelBags.it This test is not yet approved or cleared by the [place] FDA and  has been authorized for detection and/or diagnosis of SARS-CoV-2 by  FDA under an Emergency Use Authorization (EUA). This EUA will remain  in  effect (meaning this test can be used) for the duration of the  Covid-19 declaration under Section 564(b)(1) of the Act, 21  U.S.C. section 360bbb-3(b)(1), unless the authorization is  terminated or revoked. Performed at [facility], [address], [place], [place] [address]   MRSA PCR Screening     Status: None   Collection Time: 03/27/19  4:57 PM   Specimen: Nasal Mucosa; Nasopharyngeal  Result Value Ref Range Status   MRSA by PCR NEGATIVE NEGATIVE Final    Comment:        The GeneXpert MRSA Assay (FDA approved for NASAL specimens only), is one component of a comprehensive MRSA colonization surveillance program. It is not intended to diagnose MRSA infection nor to guide or monitor treatment for MRSA infections. Performed at [facility], [phone]  [address] [place], [place] [address]      Scheduled Meds: . carvedilol  12.5 mg Oral Q12H  . Chlorhexidine Gluconate Cloth  6 each Topical Q0600  . collagenase   Topical Daily  . DULoxetine  30 mg Oral Daily  . enoxaparin (LOVENOX) injection  0.5 mg/kg Subcutaneous Q24H  . finasteride  5 mg Oral Daily  . gabapentin  200 mg Oral TID  . insulin aspart  0-15 Units Subcutaneous TID WC  . insulin aspart  0-5 Units Subcutaneous QHS  . insulin aspart  8 Units Subcutaneous TID WC  . insulin detemir  20 Units Subcutaneous BID  . tamsulosin  0.4 mg Oral QPC supper   Continuous Infusions: . ceFEPime (MAXIPIME) IV 2 g (03/31/19 1308)  . metronidazole 500 mg (03/31/19 1516)  . vancomycin 1,000 mg (03/31/19 1316)    Procedures/Studies: CT Head Wo Contrast  Result Date: 03/27/2019 CLINICAL DATA:  Altered mental status EXAM: CT HEAD WITHOUT CONTRAST TECHNIQUE: Contiguous axial images were obtained from the base of the skull through the vertex without intravenous contrast. COMPARISON:  None. FINDINGS: Brain: No evidence of acute infarction, hemorrhage, hydrocephalus, extra-axial collection or mass lesion/mass effect. Vascular: Atherosclerotic  calcifications involving the large vessels of the skull base. No unexpected hyperdense vessel. Skull: Normal. Negative for fracture or focal lesion. Sinuses/Orbits: No acute finding. Other: Within the subcutaneous soft tissues of the posterior right neck is a rounded low attenuation structure measuring 1.8 x 1.6 x 1.8 cm (series 3, image 34; series 7, image 66). IMPRESSION: 1. No acute intracranial pathology. 2. Rounded low attenuation structure in the subcutaneous soft tissues of the posterior right neck measuring 1.8 cm. This may represent a sebaceous cyst or other cystic lesion. Correlate with physical exam. Electronically Signed   By: Duanne Guess D.O.   On: 03/27/2019 15:59   CT ABDOMEN PELVIS W CONTRAST  Result Date: 03/27/2019 CLINICAL DATA:  Abdominal pain. EXAM: CT ABDOMEN AND PELVIS WITH CONTRAST TECHNIQUE: Multidetector CT imaging of the abdomen and pelvis was performed using the standard protocol following bolus administration of intravenous contrast. CONTRAST:  OMNIPAQUE IOHEXOL 300 MG/ML  SOLN COMPARISON:  None. FINDINGS: Lower chest: Mild bibasilar scarring and/or atelectasis is seen. Hepatobiliary: No focal liver abnormality is seen. No gallstones, gallbladder wall thickening, or biliary dilatation. Pancreas: Unremarkable. No pancreatic ductal dilatation or surrounding inflammatory changes. Spleen: Normal in size without focal abnormality. Adrenals/Urinary Tract: Adrenal glands are unremarkable. Kidneys are normal in size. A 4.7 cm cyst is seen along the lateral aspect of the mid right kidney. 3.5 cm, 3.8 cm and 4.3 cm cysts are seen within the left kidney. A 6 mm nonobstructing renal stone is seen within the anterior aspect of the mid right kidney. A 5 mm nonobstructing renal stone is seen within the lower pole of the left kidney. There is no evidence of hydronephrosis or renal obstruction. Bladder is unremarkable. Stomach/Bowel: A 3.4 cm x 2.8 cm gastric diverticulum is seen along the  posteromedial aspect of the body of the stomach. Appendix appears normal. No evidence of bowel wall thickening, distention, or inflammatory changes. Noninflamed diverticula are seen within the large bowel. Vascular/Lymphatic: No significant vascular findings are present. No enlarged abdominal or pelvic lymph nodes. Reproductive: Prostate is unremarkable. Other: No abdominal wall hernia or abnormality. No abdominopelvic ascites. Musculoskeletal: Multilevel degenerative changes seen within the lumbar spine. IMPRESSION: 1. Bilateral nonobstructing renal calculi. 2. Noninflamed gastric and colonic diverticula. 3. Multiple bilateral renal cysts. Electronically Signed   By: Waylan Rocher  Houston M.D.   On: 03/27/2019 18:09   US ARTERIAL SEG MULTIPLE LE (ABI, SEGMENTAL PRESSURES, PVR'S)  Result Date: 03/29/2019 CLINICAL DATA:  History of right BKA. Left foot redness with a wound. EXAM: NONINVASIVE PHYSIOLOGIC VASCULAR STUDY OF BILATERAL LOWER EXTREMITIES TECHNIQUE: Non-invasive vascular evaluation of both lower extremities was performed at rest, including calculation of ankle-brachial indices, multiple segmental pressure evaluation, segmental Doppler and segmental pulse volume recording. COMPARISON:  None. FINDINGS: Right Lower Extremity Resting ABI:  Below the knee amputation Normal triphasic waveforms in the right thigh. Left Lower Extremity: Resting ABI: 1.03 Resting TBI: Not obtained Segmental Pressures: Normal segmental pressures, no significant (20 mmHg) pressure gradient between adjacent segments. Arterial Waveforms: Waveforms at the left common femoral artery are slightly irregular. Biphasic waveforms at the left SFA. Triphasic waveforms at the left popliteal artery. Triphasic waveforms at the left posterior tibial artery. Monophasic waveforms at the left dorsalis pedis artery. PVRs: Normal PVRs with maintained waveform amplitude and quality. Ankle Brachial index > 1.4 Non diagnostic secondary to incompressible  vessel calcifications 1.0-1.4       Normal 0.9-0.99     Borderline PAD 0.8-0.89     Mild PAD 0.5-0.79     Moderate PAD < 0.5          Severe PAD IMPRESSION: Normal left ankle-brachial index, measuring 1.03. Left lower extremity waveforms are normal with exception of the left dorsalis pedis artery. Electronically Signed   By: Richarda Overlie M.D.   On: 03/29/2019 12:57   DG Chest Port 1 View  Result Date: 03/28/2019 CLINICAL DATA:  PICC line placement EXAM: PORTABLE CHEST 1 VIEW COMPARISON:  March 27, 2019 FINDINGS: The right-sided PICC line terminates over the SVC. The heart size is stable from prior study. There is some atelectasis at the lung bases without evidence for large focal infiltrate. There is no pneumothorax. No large pleural effusion. IMPRESSION: Well position right-sided PICC line. Otherwise stable appearance of the chest. Electronically Signed   By: Katherine Mantle M.D.   On: 03/28/2019 21:05   DG Chest Port 1 View  Result Date: 03/27/2019 CLINICAL DATA:  Altered mental status. Nonverbal. EXAM: PORTABLE CHEST 1 VIEW COMPARISON:  03/11/2019 FINDINGS: Low lung volumes. There is patchy infiltrate at the LEFT lung base, new since the prior study. No pulmonary edema. No pleural effusions. Remote resection of the distal LEFT clavicle. IMPRESSION: New LEFT lower lobe infiltrate. Electronically Signed   By: Norva Pavlov M.D.   On: 03/27/2019 14:24   DG Chest Port 1 View  Result Date: 03/11/2019 CLINICAL DATA:  Pt presents with EMS with c/o SI. Per EMS, pt reports that he was at a hotel and hotel management wanted him gone. EMS reported that he told them he was SI when they attempted to make him leave. Pt is not very cooperative when an.*comment was truncated* EXAM: PORTABLE CHEST 1 VIEW COMPARISON:  None. FINDINGS: Normal mediastinum and cardiac silhouette. Low lung volumes. Normal pulmonary vasculature. No evidence of effusion, infiltrate, or pneumothorax. No acute bony abnormality. IMPRESSION:  No acute cardiopulmonary process. Electronically Signed   By: Genevive Bi M.D.   On: 03/11/2019 13:39   DG Foot 2 Views Left  Result Date: 03/27/2019 CLINICAL DATA:  Nonverbal. Ulcer of the bottom of the LEFT foot. RIGHT BKA. EXAM: LEFT FOOT - 2 VIEW COMPARISON:  None. FINDINGS: Significant soft tissue swelling of the forefoot. Remote segmental resection or trauma of the 5th metatarsal. No acute fracture or subluxation. No radiopaque foreign body or  soft tissue gas. Achilles calcaneal spur noted. IMPRESSION: Soft tissue swelling. No evidence for acute osseous abnormality. Electronically Signed   By: Norva PavlovElizabeth  Brown M.D.   On: 03/27/2019 14:26   ECHOCARDIOGRAM COMPLETE  Result Date: 03/28/2019    ECHOCARDIOGRAM REPORT   Patient Name:   Antoine PocheJOHN Pless Date of Exam: 03/28/2019 Medical Rec #:  [id number]    Height:       74.0 in Accession #:    [id number]   Weight:       265.2 lb Date of Birth:  03/25/1955     BSA:          2.450 m Patient Age:    63 years     BP:           154/101 mmHg Patient Gender: M            HR:           91 bpm. [facility] Procedure: 2D Echo, Cardiac Doppler and Color Doppler Indications:    Atrial Fibrillation 427.31 / I48.91  History:        Patient has no prior history of Echocardiogram examinations.                 Risk Factors:Hypertension and Diabetes. Schizophrenia (HCC)                 (From Hx).  Sonographer:    Celesta GentileBernard White RCS Referring Phys: [phone] Heloise BeechamJIROGHENE E Eff[facility] HospitalEMOKPAE  Sonographer Comments: Somewhat difficult due to patient not cooperating and combative at times. IMPRESSIONS  1. Left ventricular ejection fraction, by estimation, is 60 to 65%. The left ventricle has normal function. The left ventricle has no regional wall motion abnormalities. Left ventricular diastolic parameters are consistent with Grade I diastolic dysfunction (impaired relaxation).  2. Right ventricular systolic function is normal. The right ventricular size is normal.  3. The mitral valve  is normal in structure. No evidence of mitral valve regurgitation. No evidence of mitral stenosis.  4. The aortic valve is tricuspid. Aortic valve regurgitation is not visualized. Mild to moderate aortic valve sclerosis/calcification is present, without any evidence of aortic stenosis.  5. The inferior vena cava is normal in size with greater than 50% respiratory variability, suggesting right atrial pressure of 3 mmHg. FINDINGS  Left Ventricle: Left ventricular ejection fraction, by estimation, is 60 to 65%. The left ventricle has normal function. The left ventricle has no regional wall motion abnormalities. The left ventricular internal cavity size was normal in size. There is  no left ventricular hypertrophy. Left ventricular diastolic parameters are consistent with Grade I diastolic dysfunction (impaired relaxation). Right Ventricle: The right ventricular size is normal. No increase in right ventricular wall thickness. Right ventricular systolic function is normal. Left Atrium: Left atrial size was normal in size. Right Atrium: Right atrial size was normal in size. Pericardium: There is no evidence of pericardial effusion. Mitral Valve: The mitral valve is normal in structure. Normal mobility of the mitral valve leaflets. No evidence of mitral valve regurgitation. No evidence of mitral valve stenosis. Tricuspid Valve: The tricuspid valve is normal in structure. Tricuspid valve regurgitation is not demonstrated. No evidence of tricuspid stenosis. Aortic Valve: The aortic valve is tricuspid. . There is moderate thickening and moderate calcification of the aortic valve. Aortic valve regurgitation is not visualized. Mild to moderate aortic valve sclerosis/calcification is present, without any evidence of aortic stenosis. There is moderate thickening of the aortic valve. There is moderate calcification  of the aortic valve. Pulmonic Valve: The pulmonic valve was normal in structure. Pulmonic valve regurgitation is not  visualized. No evidence of pulmonic stenosis. Aorta: The aortic root is normal in size and structure. Venous: The inferior vena cava is normal in size with greater than 50% respiratory variability, suggesting right atrial pressure of 3 mmHg. IAS/Shunts: No atrial level shunt detected by color flow Doppler.  LEFT VENTRICLE PLAX 2D LVIDd:         5.26 cm  Diastology LVIDs:         3.22 cm  LV e' lateral:   10.20 cm/s LV PW:         1.12 cm  LV E/e' lateral: 11.4 LV IVS:        1.18 cm  LV e' medial:    7.07 cm/s LVOT diam:     1.90 cm  LV E/e' medial:  16.4 LV SV:         57 LV SV Index:   23 LVOT Area:     2.84 cm  RIGHT VENTRICLE RV Mid diam:    2.65 cm RV S prime:     10.40 cm/s TAPSE (M-mode): 2.2 cm LEFT ATRIUM             Index LA diam:        3.55 cm 1.45 cm/m LA Vol (A2C):   59.7 ml 24.36 ml/m LA Vol (A4C):   70.5 ml 28.77 ml/m LA Biplane Vol: 68.9 ml 28.12 ml/m  AORTIC VALVE LVOT Vmax:   95.30 cm/s LVOT Vmean:  70.800 cm/s LVOT VTI:    0.201 m  AORTA Ao Root diam: 3.15 cm MITRAL VALVE MV Area (PHT): 3.17 cm     SHUNTS MV Decel Time: 239 msec     Systemic VTI:  0.20 m MV E velocity: 116.00 cm/s  Systemic Diam: 1.90 cm MV A velocity: 109.00 cm/s MV E/A ratio:  1.06 Donato Schultz MD Electronically signed by Donato Schultz MD Signature Date/Time: 03/28/2019/12:54:20 PM    Final    [place] EKG SITE RITE  Result Date: 03/28/2019 If Site Rite image not attached, placement could not be confirmed due to current cardiac rhythm.   Catarina Hartshorn, DO  [facility]  If 7PM-7AM, please contact night-coverage [email or web address] Password [facility] 03/31/2019, 4:42 PM   LOS: 4 days

## 2019-03-31 NOTE — Care Management Important Message (Signed)
Important Message  Patient Details  Name: Darren West MRN: [id number] Date of Birth: January 20, 1955   Medicare Important Message Given:  Yes     Corey Harold 03/31/2019, 1:47 PM

## 2019-03-31 NOTE — Plan of Care (Signed)

## 2019-03-31 NOTE — Progress Notes (Signed)
  Subjective: Patient was alert and oriented and able to answer questions appropriately. Patient is not currently experiencing any pain in his lower extremities, but states he has some left calf "discomfort." Patient denies and fever, chills, light-headedness, nausea, or vomiting.  Objective: Vital signs in last 24 hours: Temp:  [97.8 F (36.6 C)-98.5 F (36.9 C)] 97.8 F (36.6 C) (03/24 0615) Pulse Rate:  [80-84] 80 (03/24 0615) Resp:  [16-22] 16 (03/24 0615) BP: (139-147)/(91-98) 147/98 (03/24 0615) SpO2:  [94 %-96 %] 96 % (03/24 0615) Last BM Date: 03/29/19  Intake/Output from previous day: 03/23 0701 - 03/24 0700 In: -  Out: 1600 [Urine:1600] Intake/Output this shift: Total I/O In: 240 [P.O.:240] Out: 1950 [Urine:1950]  General appearance: alert, cooperative and no distress Head: Normocephalic, without obvious abnormality, atraumatic Eyes: Extra ocular movements in tact. Resp: clear to auscultation bilaterally and normal respiratory effort. Cardio: regular rate and rhythm, S1, S2 normal, no murmur, click, rub or gallop GI: soft, non-tender; bowel sounds normal; no masses,  no organomegaly Pulses: Palpable left posterior tibial and femoral pulses. Skin: Ulceration on plantar aspect of ventral side of left foot. without eschar ro drainage. Psych: Mood and affect appropriate.  Lab Results:  No results for input(s): WBC, HGB, HCT, PLT in the last 72 hours. BMET Recent Labs    03/30/19 0431  NA 142  K 4.2  CL 104  CO2 31  GLUCOSE 245*  BUN 13  CREATININE 0.78  CALCIUM 8.8*   PT/INR No results for input(s): LABPROT, INR in the last 72 hours.  Studies/Results: No results found.  Anti-infectives: Anti-infectives (From admission, onward)   Start     Dose/Rate Route Frequency Ordered Stop   03/28/19 0200  vancomycin (VANCOCIN) IVPB 1000 mg/200 mL premix     1,000 mg 200 mL/hr over 60 Minutes Intravenous Every 12 hours 03/27/19 1348     03/27/19 2300   metroNIDAZOLE (FLAGYL) IVPB 500 mg     500 mg 100 mL/hr over 60 Minutes Intravenous Every 8 hours 03/27/19 1639     03/27/19 2000  ceFEPIme (MAXIPIME) 2 g in sodium chloride 0.9 % 100 mL IVPB     2 g 200 mL/hr over 30 Minutes Intravenous Every 8 hours 03/27/19 1348     03/27/19 1346  vancomycin (VANCOREADY) IVPB 2000 mg/400 mL     2,000 mg 200 mL/hr over 120 Minutes Intravenous  Once 03/27/19 1346 03/27/19 1718   03/27/19 1345  ceFEPIme (MAXIPIME) 2 g in sodium chloride 0.9 % 100 mL IVPB     2 g 200 mL/hr over 30 Minutes Intravenous  Once 03/27/19 1341 03/27/19 1512   03/27/19 1345  metroNIDAZOLE (FLAGYL) IVPB 500 mg     500 mg 100 mL/hr over 60 Minutes Intravenous  Once 03/27/19 1341 03/27/19 1621   03/27/19 1345  vancomycin (VANCOCIN) IVPB 1000 mg/200 mL premix  Status:  Discontinued     1,000 mg 200 mL/hr over 60 Minutes Intravenous  Once 03/27/19 1341 03/27/19 1346      Assessment/Plan:  Interpretation: 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  Plan: Continue santyl and wet-to dry dressings. Continue antibiotic coverage. Continue glycemic control. Will see patient again tomorrow to observe progression of the wound.   LOS: 4 days    Darren West 03/31/2019

## 2019-04-01 DIAGNOSIS — I10 Essential (primary) hypertension: Secondary | ICD-10-CM

## 2019-04-01 DIAGNOSIS — L97529 Non-pressure chronic ulcer of other part of left foot with unspecified severity: Secondary | ICD-10-CM

## 2019-04-01 DIAGNOSIS — I4891 Unspecified atrial fibrillation: Secondary | ICD-10-CM

## 2019-04-01 DIAGNOSIS — Z89511 Acquired absence of right leg below knee: Secondary | ICD-10-CM

## 2019-04-01 DIAGNOSIS — E13621 Other specified diabetes mellitus with foot ulcer: Secondary | ICD-10-CM

## 2019-04-01 LAB — CBC
HCT: 44.7 % (ref 39.0–52.0)
Hemoglobin: 14.6 g/dL (ref 13.0–17.0)
MCH: 28.7 pg (ref 26.0–34.0)
MCHC: 32.7 g/dL (ref 30.0–36.0)
MCV: 88 fL (ref 80.0–100.0)
Platelets: 188 10*3/uL (ref 150–400)
RBC: 5.08 MIL/uL (ref 4.22–5.81)
RDW: 11.7 % (ref 11.5–15.5)
WBC: 8.9 10*3/uL (ref 4.0–10.5)
nRBC: 0 % (ref 0.0–0.2)

## 2019-04-01 LAB — BASIC METABOLIC PANEL
Anion gap: 9 (ref 5–15)
BUN: 16 mg/dL (ref 8–23)
CO2: 30 mmol/L (ref 22–32)
Calcium: 8.9 mg/dL (ref 8.9–10.3)
Chloride: 99 mmol/L (ref 98–111)
Creatinine, Ser: 0.82 mg/dL (ref 0.61–1.24)
GFR calc Af Amer: >60 mL/min (ref >60)
GFR calc non Af Amer: >60 mL/min (ref >60)
Glucose, Bld: 296 mg/dL — ABNORMAL HIGH (ref 70–99)
Potassium: 4.1 mmol/L (ref 3.5–5.1)
Sodium: 138 mmol/L (ref 135–145)

## 2019-04-01 LAB — GLUCOSE, CAPILLARY
Glucose-Capillary: 225 mg/dL — ABNORMAL HIGH (ref 70–99)
Glucose-Capillary: 190 mg/dL — ABNORMAL HIGH (ref 70–99)
Glucose-Capillary: 292 mg/dL — ABNORMAL HIGH (ref 70–99)
Glucose-Capillary: 113 mg/dL — ABNORMAL HIGH (ref 70–99)
Glucose-Capillary: 66 mg/dL — ABNORMAL LOW (ref 70–99)
Glucose-Capillary: 307 mg/dL — ABNORMAL HIGH (ref 70–99)

## 2019-04-01 LAB — CULTURE, BLOOD (ROUTINE X 2)
Culture: NO GROWTH
Culture: NO GROWTH

## 2019-04-01 LAB — MAGNESIUM: Magnesium: 1.9 mg/dL (ref 1.7–2.4)

## 2019-04-01 MED ORDER — DOXYCYCLINE HYCLATE 100 MG PO TABS: 100.0000 mg | ORAL_TABLET | Freq: Two times a day (BID) | ORAL | 0 refills | Status: DC

## 2019-04-01 MED ORDER — DOXYCYCLINE HYCLATE 100 MG PO TABS
100.0000 mg | ORAL_TABLET | Freq: Two times a day (BID) | ORAL | Status: DC
  Administered 2019-04-01 – 2019-04-02 (×2): 100 mg via ORAL
  Filled 2019-04-01 (×2): qty 1

## 2019-04-01 MED ORDER — INSULIN ASPART 100 UNIT/ML ~~LOC~~ SOLN
12.0000 [IU] | Freq: Three times a day (TID) | SUBCUTANEOUS | Status: DC
  Administered 2019-04-02 (×2): 12 [IU] via SUBCUTANEOUS

## 2019-04-01 MED ORDER — CARVEDILOL 12.5 MG PO TABS: 12.5000 mg | ORAL_TABLET | Freq: Two times a day (BID) | ORAL | 1 refills | Status: AC

## 2019-04-01 MED ORDER — AMOXICILLIN-POT CLAVULANATE 875-125 MG PO TABS
1.0000 | ORAL_TABLET | Freq: Two times a day (BID) | ORAL | Status: DC
  Administered 2019-04-01 – 2019-04-02 (×3): 1 via ORAL
  Filled 2019-04-01 (×3): qty 1

## 2019-04-01 MED ORDER — AMOXICILLIN-POT CLAVULANATE 875-125 MG PO TABS: 1.0000 | ORAL_TABLET | Freq: Two times a day (BID) | ORAL | 0 refills | Status: DC

## 2019-04-01 MED ORDER — INSULIN DETEMIR 100 UNIT/ML ~~LOC~~ SOLN
22.0000 [IU] | Freq: Two times a day (BID) | SUBCUTANEOUS | Status: DC
  Administered 2019-04-01 – 2019-04-02 (×2): 22 [IU] via SUBCUTANEOUS
  Filled 2019-04-01 (×8): qty 0.22

## 2019-04-01 NOTE — Care Management (Signed)
Discussed disposition with patient.  He had a bed offer from [facility], they rescinded offer once they read his clinical notes.    He is not agreeable to [facility] or referral to [facility] care program. He plans to appeal his discharge.   He reports he has worked out a deal with Stage manager but needs his ID, he has ordered replacement cards. Updated patient that he could not remain in hospital until cards arrive.   CM called [facility] and discussed ID with staff member, they report they will accept hospital discharge paperwork with patient's name as from of ID until his ID arrives.     Patient belongings has been retrieved from [facility] by [place] [employer] and delivered to patient.   TOC to follow and continue to work with patient on safe discharge plan. At this time, patient is only agreeable to discharge to a hotel room.

## 2019-04-01 NOTE — Discharge Summary (Signed)
Physician Discharge Summary  Anthonyjames Bargar FIE:[id number] DOB: 02-10-1955 DOA: 03/27/2019  PCP: Patient, No Pcp Per  Admit date: 03/27/2019 Discharge date: 04/01/2019  Admitted From: [facility]el Disposition:  [facility]  Recommendations for Outpatient Follow-up:  1. Follow up with PCP in 1-2 weeks 2. Please obtain BMP/CBC in one week     Discharge Condition: Stable CODE STATUS: FULL Diet recommendation: Heart Healthy / Carb Modified   Brief/Interim Summary: 64 year old male with a history of diabetes mellitus, hypertension, schizophrenia, right BKA presenting from hotel shelter secondary to mental status change.  The patient was recently hospitalized at behavioral health from 03/19/2019 to 03/22/2019 secondary to suicidal ideation.  He was discharged to hotel where he stayed for approximately 3 days.  Apparently, the patient slid out of his wheelchair and since then has been confused and more lethargic.  In the emergency department, the patient had a serum glucose of 733 with bicarbonate 29.  He was treated for a nonketotic hyperosmolar state and started on IV fluids and IV insulin.  He was subsequently transitioned to subcutaneous insulin.  The patient was started on IV antibiotics for presumptive left lower extremity cellulitis/infected diabetic foot ulcer.  The patient's mental status improved.  He remained afebrile and hemodynamically stable.  The patient improved clinically and remained afebrile and hemodynamically stable.  The patient was transitioned to oral antibiotics.  Social work continue to work with the patient given his difficult social situation.  The patient was offered multiple resources to assist with his disposition and discharge due to his social situation.  The patient refused all attempts to assist in his disposition and medical care thereafter.   Discharge Diagnoses:  Nonketotic hyperosmolar state -patient started on IV insulin with q 1 hour CBG check and q 4 hour  BMPs -pt started on aggressive fluid resuscitation -Electrolytes were monitored and repleted -transitioned to [place] insulin once anion gap closed -diet was advanced once anion gap closed -HbA1C--12.0  Uncontrolled diabetes mellitus type 2 with hyperglycemia -03/28/2019 hemoglobin A1c 12.0 -Increase Levemir to 20 units twice daily -Increase NovoLog 10 units with meals -Holding metformin--restart after discharge  Cellulitis left lower extremity/diabetic foot ulcer -Improved on IV antibiotics -Transition to oral antibiotics--amox/clav and doxy x 7 more days -03/29/2019 ABI--LLE--1.03 -03/29/2019 ABI--RLE--triphasic waves at the thigh -3/25--foot wound looks clean  Acute metabolic encephalopathy -Secondary to [facility] -Improved--back to baseline -CT brain negative -UA negative for pyuria  Transient atrial fibrillation -The patient had transient episode of atrial fibrillation -Given acute medical stress and short duration, do not plan to start anticoagulation -Continue carvedilol -03/28/2019 echo EF 60-65%, no WMA, grade 1 DD  Essential hypertension -Continue carvedilol  Pulmonary infiltrate -Personally reviewed chest x-ray--bibasilar atelectasis -Patient is stable on room air without dyspnea  BPH -Continue tamsulosin and finasteride -Patient reports no urinary retention symptoms  Depression -Continue Cymbalta -Patient has been cleared by TTS -No hallucinations or suicidal ideation  Right BKA status -PT eval--> SNF, pt refuses.  Also unable to place due to difficulty with insurance   Discharge Instructions   Allergies as of 04/01/2019   No Known Allergies     Medication List    STOP taking these medications   lisinopril 5 MG tablet Commonly known as: ZESTRIL   silver sulfADIAZINE 1 % cream Commonly known as: SILVADENE     TAKE these medications   acetaminophen 500 MG tablet Commonly known as: TYLENOL Take 500 mg by mouth every 6 (six) hours as needed  for mild pain.   albuterol 108 (90  Base) MCG/ACT inhaler Commonly known as: VENTOLIN HFA Inhale 2 puffs into the lungs every 6 (six) hours as needed for wheezing or shortness of breath.   amoxicillin-clavulanate 875-125 MG tablet Commonly known as: AUGMENTIN Take 1 tablet by mouth every 12 (twelve) hours.   baclofen 10 MG tablet Commonly known as: LIORESAL Take 10 mg by mouth 3 (three) times daily as needed for muscle spasms.   budesonide-formoterol 160-4.5 MCG/ACT inhaler Commonly known as: SYMBICORT Inhale 2 puffs into the lungs 2 (two) times daily.   calcium carbonate 500 MG chewable tablet Commonly known as: TUMS - dosed in mg elemental calcium Chew 1 tablet by mouth 2 (two) times daily as needed for indigestion or heartburn.   carvedilol 12.5 MG tablet Commonly known as: COREG Take 1 tablet (12.5 mg total) by mouth every 12 (twelve) hours. What changed:   medication strength  how much to take   doxycycline 100 MG tablet Commonly known as: VIBRA-TABS Take 1 tablet (100 mg total) by mouth every 12 (twelve) hours.   DULoxetine 30 MG capsule Commonly known as: CYMBALTA Take 30 mg by mouth 2 (two) times daily.   finasteride 5 MG tablet Commonly known as: PROSCAR Take 5 mg by mouth daily.   gabapentin 300 MG capsule Commonly known as: NEURONTIN Take 600 mg by mouth 2 (two) times daily as needed (pain).   hydrOXYzine 25 MG capsule Commonly known as: VISTARIL Take 25 mg by mouth 3 (three) times daily as needed (sleep).   insulin aspart 100 UNIT/ML injection Commonly known as: novoLOG Inject 25 Units into the skin 3 (three) times daily before meals.   insulin glargine 100 UNIT/ML Solostar Pen Commonly known as: LANTUS Inject 25 Units into the skin in the morning, at noon, and at bedtime.   ipratropium-albuterol 0.5-2.5 (3) MG/3ML Soln Commonly known as: DUONEB Take 3 mLs by nebulization every 4 (four) hours as needed (wheezing/SOB).   isosorbide mononitrate  30 MG 24 hr tablet Commonly known as: IMDUR Take 30 mg by mouth daily.   metFORMIN 500 MG tablet Commonly known as: GLUCOPHAGE Take 500 mg by mouth daily with breakfast.   nicotine polacrilex 2 MG lozenge Commonly known as: COMMIT Take 2 mg by mouth as needed for smoking cessation.   Santyl ointment Generic drug: collagenase Apply 1 application topically daily.   tamsulosin 0.4 MG Caps capsule Commonly known as: FLOMAX Take 0.4 mg by mouth daily after supper.   traZODone 100 MG tablet Commonly known as: DESYREL Take 100 mg by mouth at bedtime as needed for sleep.       No Known Allergies  Consultations:  General surgery   Procedures/Studies: CT Head Wo Contrast  Result Date: 03/27/2019 CLINICAL DATA:  Altered mental status EXAM: CT HEAD WITHOUT CONTRAST TECHNIQUE: Contiguous axial images were obtained from the base of the skull through the vertex without intravenous contrast. COMPARISON:  None. FINDINGS: Brain: No evidence of acute infarction, hemorrhage, hydrocephalus, extra-axial collection or mass lesion/mass effect. Vascular: Atherosclerotic calcifications involving the large vessels of the skull base. No unexpected hyperdense vessel. Skull: Normal. Negative for fracture or focal lesion. Sinuses/Orbits: No acute finding. Other: Within the subcutaneous soft tissues of the posterior right neck is a rounded low attenuation structure measuring 1.8 x 1.6 x 1.8 cm (series 3, image 34; series 7, image 66). IMPRESSION: 1. No acute intracranial pathology. 2. Rounded low attenuation structure in the subcutaneous soft tissues of the posterior right neck measuring 1.8 cm. This may represent a sebaceous cyst  or other cystic lesion. Correlate with physical exam. Electronically Signed   By: Duanne Guess D.O.   On: 03/27/2019 15:59   CT ABDOMEN PELVIS W CONTRAST  Result Date: 03/27/2019 CLINICAL DATA:  Abdominal pain. EXAM: CT ABDOMEN AND PELVIS WITH CONTRAST TECHNIQUE: Multidetector  CT imaging of the abdomen and pelvis was performed using the standard protocol following bolus administration of intravenous contrast. CONTRAST:  OMNIPAQUE IOHEXOL 300 MG/ML  SOLN COMPARISON:  None. FINDINGS: Lower chest: Mild bibasilar scarring and/or atelectasis is seen. Hepatobiliary: No focal liver abnormality is seen. No gallstones, gallbladder wall thickening, or biliary dilatation. Pancreas: Unremarkable. No pancreatic ductal dilatation or surrounding inflammatory changes. Spleen: Normal in size without focal abnormality. Adrenals/Urinary Tract: Adrenal glands are unremarkable. Kidneys are normal in size. A 4.7 cm cyst is seen along the lateral aspect of the mid right kidney. 3.5 cm, 3.8 cm and 4.3 cm cysts are seen within the left kidney. A 6 mm nonobstructing renal stone is seen within the anterior aspect of the mid right kidney. A 5 mm nonobstructing renal stone is seen within the lower pole of the left kidney. There is no evidence of hydronephrosis or renal obstruction. Bladder is unremarkable. Stomach/Bowel: A 3.4 cm x 2.8 cm gastric diverticulum is seen along the posteromedial aspect of the body of the stomach. Appendix appears normal. No evidence of bowel wall thickening, distention, or inflammatory changes. Noninflamed diverticula are seen within the large bowel. Vascular/Lymphatic: No significant vascular findings are present. No enlarged abdominal or pelvic lymph nodes. Reproductive: Prostate is unremarkable. Other: No abdominal wall hernia or abnormality. No abdominopelvic ascites. Musculoskeletal: Multilevel degenerative changes seen within the lumbar spine. IMPRESSION: 1. Bilateral nonobstructing renal calculi. 2. Noninflamed gastric and colonic diverticula. 3. Multiple bilateral renal cysts. Electronically Signed   By: Aram Candela M.D.   On: 03/27/2019 18:09   US ARTERIAL SEG MULTIPLE LE (ABI, SEGMENTAL PRESSURES, PVR'S)  Result Date: 03/29/2019 CLINICAL DATA:  History of right  BKA. Left foot redness with a wound. EXAM: NONINVASIVE PHYSIOLOGIC VASCULAR STUDY OF BILATERAL LOWER EXTREMITIES TECHNIQUE: Non-invasive vascular evaluation of both lower extremities was performed at rest, including calculation of ankle-brachial indices, multiple segmental pressure evaluation, segmental Doppler and segmental pulse volume recording. COMPARISON:  None. FINDINGS: Right Lower Extremity Resting ABI:  Below the knee amputation Normal triphasic waveforms in the right thigh. Left Lower Extremity: Resting ABI: 1.03 Resting TBI: Not obtained Segmental Pressures: Normal segmental pressures, no significant (20 mmHg) pressure gradient between adjacent segments. Arterial Waveforms: Waveforms at the left common femoral artery are slightly irregular. Biphasic waveforms at the left SFA. Triphasic waveforms at the left popliteal artery. Triphasic waveforms at the left posterior tibial artery. Monophasic waveforms at the left dorsalis pedis artery. PVRs: Normal PVRs with maintained waveform amplitude and quality. Ankle Brachial index > 1.4 Non diagnostic secondary to incompressible vessel calcifications 1.0-1.4       Normal 0.9-0.99     Borderline PAD 0.8-0.89     Mild PAD 0.5-0.79     Moderate PAD < 0.5          Severe PAD IMPRESSION: Normal left ankle-brachial index, measuring 1.03. Left lower extremity waveforms are normal with exception of the left dorsalis pedis artery. Electronically Signed   By: Richarda Overlie M.D.   On: 03/29/2019 12:57   DG Chest Port 1 View  Result Date: 03/28/2019 CLINICAL DATA:  PICC line placement EXAM: PORTABLE CHEST 1 VIEW COMPARISON:  March 27, 2019 FINDINGS: The right-sided PICC line terminates over  the SVC. The heart size is stable from prior study. There is some atelectasis at the lung bases without evidence for large focal infiltrate. There is no pneumothorax. No large pleural effusion. IMPRESSION: Well position right-sided PICC line. Otherwise stable appearance of the chest.  Electronically Signed   By: Katherine Mantle M.D.   On: 03/28/2019 21:05   DG Chest Port 1 View  Result Date: 03/27/2019 CLINICAL DATA:  Altered mental status. Nonverbal. EXAM: PORTABLE CHEST 1 VIEW COMPARISON:  03/11/2019 FINDINGS: Low lung volumes. There is patchy infiltrate at the LEFT lung base, new since the prior study. No pulmonary edema. No pleural effusions. Remote resection of the distal LEFT clavicle. IMPRESSION: New LEFT lower lobe infiltrate. Electronically Signed   By: Norva Pavlov M.D.   On: 03/27/2019 14:24   DG Chest Port 1 View  Result Date: 03/11/2019 CLINICAL DATA:  Pt presents with EMS with c/o SI. Per EMS, pt reports that he was at a hotel and hotel management wanted him gone. EMS reported that he told them he was SI when they attempted to make him leave. Pt is not very cooperative when an.*comment was truncated* EXAM: PORTABLE CHEST 1 VIEW COMPARISON:  None. FINDINGS: Normal mediastinum and cardiac silhouette. Low lung volumes. Normal pulmonary vasculature. No evidence of effusion, infiltrate, or pneumothorax. No acute bony abnormality. IMPRESSION: No acute cardiopulmonary process. Electronically Signed   By: Genevive Bi M.D.   On: 03/11/2019 13:39   DG Foot 2 Views Left  Result Date: 03/27/2019 CLINICAL DATA:  Nonverbal. Ulcer of the bottom of the LEFT foot. RIGHT BKA. EXAM: LEFT FOOT - 2 VIEW COMPARISON:  None. FINDINGS: Significant soft tissue swelling of the forefoot. Remote segmental resection or trauma of the 5th metatarsal. No acute fracture or subluxation. No radiopaque foreign body or soft tissue gas. Achilles calcaneal spur noted. IMPRESSION: Soft tissue swelling. No evidence for acute osseous abnormality. Electronically Signed   By: Norva Pavlov M.D.   On: 03/27/2019 14:26   ECHOCARDIOGRAM COMPLETE  Result Date: 03/28/2019    ECHOCARDIOGRAM REPORT   Patient Name:   SOCRATES CAHOON Date of Exam: 03/28/2019 Medical Rec #:  [id number]    Height:       74.0 in  Accession #:    [id number]   Weight:       265.2 lb Date of Birth:  01/05/1956     BSA:          2.450 m Patient Age:    63 years     BP:           154/101 mmHg Patient Gender: M            HR:           91 bpm. [facility] Procedure: 2D Echo, Cardiac Doppler and Color Doppler Indications:    Atrial Fibrillation 427.31 / I48.91  History:        Patient has no prior history of Echocardiogram examinations.                 Risk Factors:Hypertension and Diabetes. Schizophrenia (HCC)                 (From Hx).  Sonographer:    Celesta Gentile RCS Referring Phys: [phone] Heloise [facility]ch[facility]  Sonographer Comments: Somewhat difficult due to patient not cooperating and combative at times. IMPRESSIONS  1. Left ventricular ejection fraction, by estimation, is 60 to 65%. The left ventricle has normal function. The left  ventricle has no regional wall motion abnormalities. Left ventricular diastolic parameters are consistent with Grade I diastolic dysfunction (impaired relaxation).  2. Right ventricular systolic function is normal. The right ventricular size is normal.  3. The mitral valve is normal in structure. No evidence of mitral valve regurgitation. No evidence of mitral stenosis.  4. The aortic valve is tricuspid. Aortic valve regurgitation is not visualized. Mild to moderate aortic valve sclerosis/calcification is present, without any evidence of aortic stenosis.  5. The inferior vena cava is normal in size with greater than 50% respiratory variability, suggesting right atrial pressure of 3 mmHg. FINDINGS  Left Ventricle: Left ventricular ejection fraction, by estimation, is 60 to 65%. The left ventricle has normal function. The left ventricle has no regional wall motion abnormalities. The left ventricular internal cavity size was normal in size. There is  no left ventricular hypertrophy. Left ventricular diastolic parameters are consistent with Grade I diastolic dysfunction (impaired relaxation). Right  Ventricle: The right ventricular size is normal. No increase in right ventricular wall thickness. Right ventricular systolic function is normal. Left Atrium: Left atrial size was normal in size. Right Atrium: Right atrial size was normal in size. Pericardium: There is no evidence of pericardial effusion. Mitral Valve: The mitral valve is normal in structure. Normal mobility of the mitral valve leaflets. No evidence of mitral valve regurgitation. No evidence of mitral valve stenosis. Tricuspid Valve: The tricuspid valve is normal in structure. Tricuspid valve regurgitation is not demonstrated. No evidence of tricuspid stenosis. Aortic Valve: The aortic valve is tricuspid. . There is moderate thickening and moderate calcification of the aortic valve. Aortic valve regurgitation is not visualized. Mild to moderate aortic valve sclerosis/calcification is present, without any evidence of aortic stenosis. There is moderate thickening of the aortic valve. There is moderate calcification of the aortic valve. Pulmonic Valve: The pulmonic valve was normal in structure. Pulmonic valve regurgitation is not visualized. No evidence of pulmonic stenosis. Aorta: The aortic root is normal in size and structure. Venous: The inferior vena cava is normal in size with greater than 50% respiratory variability, suggesting right atrial pressure of 3 mmHg. IAS/Shunts: No atrial level shunt detected by color flow Doppler.  LEFT VENTRICLE PLAX 2D LVIDd:         5.26 cm  Diastology LVIDs:         3.22 cm  LV e' lateral:   10.20 cm/s LV PW:         1.12 cm  LV E/e' lateral: 11.4 LV IVS:        1.18 cm  LV e' medial:    7.07 cm/s LVOT diam:     1.90 cm  LV E/e' medial:  16.4 LV SV:         57 LV SV Index:   23 LVOT Area:     2.84 cm  RIGHT VENTRICLE RV Mid diam:    2.65 cm RV S prime:     10.40 cm/s TAPSE (M-mode): 2.2 cm LEFT ATRIUM             Index LA diam:        3.55 cm 1.45 cm/m LA Vol (A2C):   59.7 ml 24.36 ml/m LA Vol (A4C):   70.5 ml  28.77 ml/m LA Biplane Vol: 68.9 ml 28.12 ml/m  AORTIC VALVE LVOT Vmax:   95.30 cm/s LVOT Vmean:  70.800 cm/s LVOT VTI:    0.201 m  AORTA Ao Root diam: 3.15 cm MITRAL VALVE MV Area (PHT):  3.17 cm     SHUNTS MV Decel Time: 239 msec     Systemic VTI:  0.20 m MV E velocity: 116.00 cm/s  Systemic Diam: 1.90 cm MV A velocity: 109.00 cm/s MV E/A ratio:  1.06 Donato SchultzMark Skains MD Electronically signed by Donato SchultzMark Skains MD Signature Date/Time: 03/28/2019/12:54:20 PM    Final    US EKG SITE RITE  Result Date: 03/28/2019 If Site Rite image not attached, placement could not be confirmed due to current cardiac rhythm.       Discharge Exam: Vitals:   04/01/19 1328 04/01/19 1415  BP: (!) 129/109   Pulse: 82   Resp: 18   Temp:  98.7 F (37.1 C)  SpO2: 96%    Vitals:   04/01/19 0835 04/01/19 0944 04/01/19 1328 04/01/19 1415  BP:  106/77 (!) 129/109   Pulse:  83 82   Resp:  19 18   Temp:    98.7 F (37.1 C)  TempSrc:   Oral   SpO2: 94% 95% 96%   Weight:      Height:        General: Pt is alert, awake, not in acute distress Cardiovascular: RRR, S1/S2 +, no rubs, no gallops Respiratory: CTA bilaterally, no wheezing, no rhonchi Abdominal: Soft, NT, ND, bowel sounds + Extremities: no edema, no cyanosis   The results of significant diagnostics from this hospitalization (including imaging, microbiology, ancillary and laboratory) are listed below for reference.    Significant Diagnostic Studies: CT Head Wo Contrast  Result Date: 03/27/2019 CLINICAL DATA:  Altered mental status EXAM: CT HEAD WITHOUT CONTRAST TECHNIQUE: Contiguous axial images were obtained from the base of the skull through the vertex without intravenous contrast. COMPARISON:  None. FINDINGS: Brain: No evidence of acute infarction, hemorrhage, hydrocephalus, extra-axial collection or mass lesion/mass effect. Vascular: Atherosclerotic calcifications involving the large vessels of the skull base. No unexpected hyperdense vessel. Skull:  Normal. Negative for fracture or focal lesion. Sinuses/Orbits: No acute finding. Other: Within the subcutaneous soft tissues of the posterior right neck is a rounded low attenuation structure measuring 1.8 x 1.6 x 1.8 cm (series 3, image 34; series 7, image 66). IMPRESSION: 1. No acute intracranial pathology. 2. Rounded low attenuation structure in the subcutaneous soft tissues of the posterior right neck measuring 1.8 cm. This may represent a sebaceous cyst or other cystic lesion. Correlate with physical exam. Electronically Signed   By: Duanne GuessNicholas  Plundo D.O.   On: 03/27/2019 15:59   CT ABDOMEN PELVIS W CONTRAST  Result Date: 03/27/2019 CLINICAL DATA:  Abdominal pain. EXAM: CT ABDOMEN AND PELVIS WITH CONTRAST TECHNIQUE: Multidetector CT imaging of the abdomen and pelvis was performed using the standard protocol following bolus administration of intravenous contrast. CONTRAST:  100mL OMNIPAQUE IOHEXOL 300 MG/ML  SOLN COMPARISON:  None. FINDINGS: Lower chest: Mild bibasilar scarring and/or atelectasis is seen. Hepatobiliary: No focal liver abnormality is seen. No gallstones, gallbladder wall thickening, or biliary dilatation. Pancreas: Unremarkable. No pancreatic ductal dilatation or surrounding inflammatory changes. Spleen: Normal in size without focal abnormality. Adrenals/Urinary Tract: Adrenal glands are unremarkable. Kidneys are normal in size. A 4.7 cm cyst is seen along the lateral aspect of the mid right kidney. 3.5 cm, 3.8 cm and 4.3 cm cysts are seen within the left kidney. A 6 mm nonobstructing renal stone is seen within the anterior aspect of the mid right kidney. A 5 mm nonobstructing renal stone is seen within the lower pole of the left kidney. There is no evidence of hydronephrosis or  renal obstruction. Bladder is unremarkable. Stomach/Bowel: A 3.4 cm x 2.8 cm gastric diverticulum is seen along the posteromedial aspect of the body of the stomach. Appendix appears normal. No evidence of bowel wall  thickening, distention, or inflammatory changes. Noninflamed diverticula are seen within the large bowel. Vascular/Lymphatic: No significant vascular findings are present. No enlarged abdominal or pelvic lymph nodes. Reproductive: Prostate is unremarkable. Other: No abdominal wall hernia or abnormality. No abdominopelvic ascites. Musculoskeletal: Multilevel degenerative changes seen within the lumbar spine. IMPRESSION: 1. Bilateral nonobstructing renal calculi. 2. Noninflamed gastric and colonic diverticula. 3. Multiple bilateral renal cysts. Electronically Signed   By: Aram Candela M.D.   On: 03/27/2019 18:09   US ARTERIAL SEG MULTIPLE LE (ABI, SEGMENTAL PRESSURES, PVR'S)  Result Date: 03/29/2019 CLINICAL DATA:  History of right BKA. Left foot redness with a wound. EXAM: NONINVASIVE PHYSIOLOGIC VASCULAR STUDY OF BILATERAL LOWER EXTREMITIES TECHNIQUE: Non-invasive vascular evaluation of both lower extremities was performed at rest, including calculation of ankle-brachial indices, multiple segmental pressure evaluation, segmental Doppler and segmental pulse volume recording. COMPARISON:  None. FINDINGS: Right Lower Extremity Resting ABI:  Below the knee amputation Normal triphasic waveforms in the right thigh. Left Lower Extremity: Resting ABI: 1.03 Resting TBI: Not obtained Segmental Pressures: Normal segmental pressures, no significant (20 mmHg) pressure gradient between adjacent segments. Arterial Waveforms: Waveforms at the left common femoral artery are slightly irregular. Biphasic waveforms at the left SFA. Triphasic waveforms at the left popliteal artery. Triphasic waveforms at the left posterior tibial artery. Monophasic waveforms at the left dorsalis pedis artery. PVRs: Normal PVRs with maintained waveform amplitude and quality. Ankle Brachial index > 1.4 Non diagnostic secondary to incompressible vessel calcifications 1.0-1.4       Normal 0.9-0.99     Borderline PAD 0.8-0.89     Mild PAD 0.5-0.79      Moderate PAD < 0.5          Severe PAD IMPRESSION: Normal left ankle-brachial index, measuring 1.03. Left lower extremity waveforms are normal with exception of the left dorsalis pedis artery. Electronically Signed   By: Richarda Overlie M.D.   On: 03/29/2019 12:57   DG Chest Port 1 View  Result Date: 03/28/2019 CLINICAL DATA:  PICC line placement EXAM: PORTABLE CHEST 1 VIEW COMPARISON:  March 27, 2019 FINDINGS: The right-sided PICC line terminates over the SVC. The heart size is stable from prior study. There is some atelectasis at the lung bases without evidence for large focal infiltrate. There is no pneumothorax. No large pleural effusion. IMPRESSION: Well position right-sided PICC line. Otherwise stable appearance of the chest. Electronically Signed   By: Katherine Mantle M.D.   On: 03/28/2019 21:05   DG Chest Port 1 View  Result Date: 03/27/2019 CLINICAL DATA:  Altered mental status. Nonverbal. EXAM: PORTABLE CHEST 1 VIEW COMPARISON:  03/11/2019 FINDINGS: Low lung volumes. There is patchy infiltrate at the LEFT lung base, new since the prior study. No pulmonary edema. No pleural effusions. Remote resection of the distal LEFT clavicle. IMPRESSION: New LEFT lower lobe infiltrate. Electronically Signed   By: Norva Pavlov M.D.   On: 03/27/2019 14:24   DG Chest Port 1 View  Result Date: 03/11/2019 CLINICAL DATA:  Pt presents with EMS with c/o SI. Per EMS, pt reports that he was at a hotel and hotel management wanted him gone. EMS reported that he told them he was SI when they attempted to make him leave. Pt is not very cooperative when an.*comment was truncated* EXAM: PORTABLE CHEST  1 VIEW COMPARISON:  None. FINDINGS: Normal mediastinum and cardiac silhouette. Low lung volumes. Normal pulmonary vasculature. No evidence of effusion, infiltrate, or pneumothorax. No acute bony abnormality. IMPRESSION: No acute cardiopulmonary process. Electronically Signed   By: Genevive Bi M.D.   On: 03/11/2019  13:39   DG Foot 2 Views Left  Result Date: 03/27/2019 CLINICAL DATA:  Nonverbal. Ulcer of the bottom of the LEFT foot. RIGHT BKA. EXAM: LEFT FOOT - 2 VIEW COMPARISON:  None. FINDINGS: Significant soft tissue swelling of the forefoot. Remote segmental resection or trauma of the 5th metatarsal. No acute fracture or subluxation. No radiopaque foreign body or soft tissue gas. Achilles calcaneal spur noted. IMPRESSION: Soft tissue swelling. No evidence for acute osseous abnormality. Electronically Signed   By: Norva Pavlov M.D.   On: 03/27/2019 14:26   ECHOCARDIOGRAM COMPLETE  Result Date: 03/28/2019    ECHOCARDIOGRAM REPORT   Patient Name:   Darren West Date of Exam: 03/28/2019 Medical Rec #:  [id number]    Height:       74.0 in Accession #:    [id number]   Weight:       265.2 lb Date of Birth:  [birth year]     BSA:          2.450 m Patient Age:    63 years     BP:           154/101 mmHg Patient Gender: M            HR:           91 bpm. [facility] Procedure: 2D Echo, Cardiac Doppler and Color Doppler Indications:    Atrial Fibrillation 427.31 / I48.91  History:        Patient has no prior history of Echocardiogram examinations.                 Risk Factors:Hypertension and Diabetes. Schizophrenia (HCC)                 (From Hx).  Sonographer:    Celesta Gentile RCS Referring Phys: [phone] Heloise Beecham [facility]  Sonographer Comments: Somewhat difficult due to patient not cooperating and combative at times. IMPRESSIONS  1. Left ventricular ejection fraction, by estimation, is 60 to 65%. The left ventricle has normal function. The left ventricle has no regional wall motion abnormalities. Left ventricular diastolic parameters are consistent with Grade I diastolic dysfunction (impaired relaxation).  2. Right ventricular systolic function is normal. The right ventricular size is normal.  3. The mitral valve is normal in structure. No evidence of mitral valve regurgitation. No evidence of mitral stenosis.   4. The aortic valve is tricuspid. Aortic valve regurgitation is not visualized. Mild to moderate aortic valve sclerosis/calcification is present, without any evidence of aortic stenosis.  5. The inferior vena cava is normal in size with greater than 50% respiratory variability, suggesting right atrial pressure of 3 mmHg. FINDINGS  Left Ventricle: Left ventricular ejection fraction, by estimation, is 60 to 65%. The left ventricle has normal function. The left ventricle has no regional wall motion abnormalities. The left ventricular internal cavity size was normal in size. There is  no left ventricular hypertrophy. Left ventricular diastolic parameters are consistent with Grade I diastolic dysfunction (impaired relaxation). Right Ventricle: The right ventricular size is normal. No increase in right ventricular wall thickness. Right ventricular systolic function is normal. Left Atrium: Left atrial size was normal in size. Right Atrium: Right atrial size was normal  in size. Pericardium: There is no evidence of pericardial effusion. Mitral Valve: The mitral valve is normal in structure. Normal mobility of the mitral valve leaflets. No evidence of mitral valve regurgitation. No evidence of mitral valve stenosis. Tricuspid Valve: The tricuspid valve is normal in structure. Tricuspid valve regurgitation is not demonstrated. No evidence of tricuspid stenosis. Aortic Valve: The aortic valve is tricuspid. . There is moderate thickening and moderate calcification of the aortic valve. Aortic valve regurgitation is not visualized. Mild to moderate aortic valve sclerosis/calcification is present, without any evidence of aortic stenosis. There is moderate thickening of the aortic valve. There is moderate calcification of the aortic valve. Pulmonic Valve: The pulmonic valve was normal in structure. Pulmonic valve regurgitation is not visualized. No evidence of pulmonic stenosis. Aorta: The aortic root is normal in size and  structure. Venous: The inferior vena cava is normal in size with greater than 50% respiratory variability, suggesting right atrial pressure of 3 mmHg. IAS/Shunts: No atrial level shunt detected by color flow Doppler.  LEFT VENTRICLE PLAX 2D LVIDd:         5.26 cm  Diastology LVIDs:         3.22 cm  LV e' lateral:   10.20 cm/s LV PW:         1.12 cm  LV E/e' lateral: 11.4 LV IVS:        1.18 cm  LV e' medial:    7.07 cm/s LVOT diam:     1.90 cm  LV E/e' medial:  16.4 LV SV:         57 LV SV Index:   23 LVOT Area:     2.84 cm  RIGHT VENTRICLE RV Mid diam:    2.65 cm RV S prime:     10.40 cm/s TAPSE (M-mode): 2.2 cm LEFT ATRIUM             Index LA diam:        3.55 cm 1.45 cm/m LA Vol (A2C):   59.7 ml 24.36 ml/m LA Vol (A4C):   70.5 ml 28.77 ml/m LA Biplane Vol: 68.9 ml 28.12 ml/m  AORTIC VALVE LVOT Vmax:   95.30 cm/s LVOT Vmean:  70.800 cm/s LVOT VTI:    0.201 m  AORTA Ao Root diam: 3.15 cm MITRAL VALVE MV Area (PHT): 3.17 cm     SHUNTS MV Decel Time: 239 msec     Systemic VTI:  0.20 m MV E velocity: 116.00 cm/s  Systemic Diam: 1.90 cm MV A velocity: 109.00 cm/s MV E/A ratio:  1.06 Donato Schultz MD Electronically signed by Donato Schultz MD Signature Date/Time: 03/28/2019/12:54:20 PM    Final    [place] EKG SITE RITE  Result Date: 03/28/2019 If Site Rite image not attached, placement could not be confirmed due to current cardiac rhythm.    Microbiology: Recent Results (from the past 240 hour(s))  Urine culture     Status: Abnormal   Collection Time: 03/27/19  1:41 PM   Specimen: In/Out Cath Urine  Result Value Ref Range Status   Specimen Description   Final    IN/OUT CATH URINE Performed at [facility], [address], [place], [place] [address]    Special Requests   Final    NONE Performed at [facility], [address] [place], [place] [address]    Culture MULTIPLE SPECIES PRESENT, SUGGEST RECOLLECTION (A)  Final   Report Status 03/29/2019 FINAL  Final  Blood Culture (routine x 2)  Status:  None   Collection Time: 03/27/19  1:46 PM   Specimen: BLOOD RIGHT HAND  Result Value Ref Range Status   Specimen Description   Final    BLOOD RIGHT HAND BOTTLES DRAWN AEROBIC AND ANAEROBIC   Special Requests   Final    Blood Culture results may not be optimal due to an inadequate volume of blood received in culture bottles   Culture   Final    NO GROWTH 5 DAYS Performed at [facility], [address] [place], [place] [address]    Report Status 04/01/2019 FINAL  Final  Blood Culture (routine x 2)     Status: None   Collection Time: 03/27/19  2:23 PM   Specimen: BLOOD RIGHT WRIST  Result Value Ref Range Status   Specimen Description   Final    BLOOD RIGHT WRIST BOTTLES DRAWN AEROBIC AND ANAEROBIC   Special Requests   Final    Blood Culture results may not be optimal due to an inadequate volume of blood received in culture bottles   Culture   Final    NO GROWTH 5 DAYS Performed at [facility], [address] [place], [place] [address]    Report Status 04/01/2019 FINAL  Final  Respiratory Panel by RT PCR (Flu A&B, Covid) - Nasopharyngeal Swab     Status: None   Collection Time: 03/27/19  3:15 PM   Specimen: Nasopharyngeal Swab  Result Value Ref Range Status   SARS Coronavirus 2 by RT PCR NEGATIVE NEGATIVE Final    Comment: (NOTE) SARS-CoV-2 target nucleic acids are NOT DETECTED. The SARS-CoV-2 RNA is generally detectable in upper respiratoy specimens during the acute phase of infection. The lowest concentration of SARS-CoV-2 viral copies this assay can detect is 131 copies/mL. A negative result does not preclude SARS-Cov-2 infection and should not be used as the sole basis for treatment or other patient management decisions. A negative result may occur with  improper specimen collection/handling, submission of specimen other than nasopharyngeal swab, presence of viral mutation(s) within the areas targeted by this assay, and inadequate number of viral copies (<131 copies/mL). A  negative result must be combined with clinical observations, patient history, and epidemiological information. The expected result is Negative. Fact Sheet for Patients:  https://www.moore.com/ Fact Sheet for Healthcare Providers:  https://www.young.biz/ This test is not yet ap proved or cleared by the [place] FDA and  has been authorized for detection and/or diagnosis of SARS-CoV-2 by FDA under an Emergency Use Authorization (EUA). This EUA will remain  in effect (meaning this test can be used) for the duration of the COVID-19 declaration under Section 564(b)(1) of the Act, 21 U.S.C. section 360bbb-3(b)(1), unless the authorization is terminated or revoked sooner.    Influenza A by PCR NEGATIVE NEGATIVE Final   Influenza B by PCR NEGATIVE NEGATIVE Final    Comment: (NOTE) The Xpert Xpress SARS-CoV-2/FLU/RSV assay is intended as an aid in  the diagnosis of influenza from Nasopharyngeal swab specimens and  should not be used as a sole basis for treatment. Nasal washings and  aspirates are unacceptable for Xpert Xpress SARS-CoV-2/FLU/RSV  testing. Fact Sheet for Patients: https://www.moore.com/ Fact Sheet for Healthcare Providers: https://www.young.biz/ This test is not yet approved or cleared by the [place] FDA and  has been authorized for detection and/or diagnosis of SARS-CoV-2 by  FDA under an Emergency Use Authorization (EUA). This EUA will remain  in effect (meaning this test can be used) for the duration of the  Covid-19  declaration under Section 564(b)(1) of the Act, 21  U.S.C. section 360bbb-3(b)(1), unless the authorization is  terminated or revoked. Performed at [facility], [address] [place], [place] [address]   MRSA PCR Screening     Status: None   Collection Time: 03/27/19  4:57 PM   Specimen: Nasal Mucosa; Nasopharyngeal  Result Value Ref Range Status   MRSA by PCR  NEGATIVE NEGATIVE Final    Comment:        The GeneXpert MRSA Assay (FDA approved for NASAL specimens only), is one component of a comprehensive MRSA colonization surveillance program. It is not intended to diagnose MRSA infection nor to guide or monitor treatment for MRSA infections. Performed at [facility], [address], [place], [place] [address]      Labs: Basic Metabolic Panel: Recent Labs  Lab 03/27/19 1410 03/27/19 1410 03/27/19 1910 03/27/19 1910 03/28/19 0318 03/28/19 0318 03/30/19 0431 04/01/19 0944  NA 146*  --  149*  --  147*  --  142 138  K 3.9   < > 3.3*   < > 3.4*   < > 4.2 4.1  CL 104  --  112*  --  114*  --  104 99  CO2 29  --  26  --  26  --  31 30  GLUCOSE 733*  --  459*  --  212*  --  245* 296*  BUN 27*  --  23  --  18  --  13 16  CREATININE 1.17  --  1.10  --  0.90  --  0.78 0.82  CALCIUM 9.9  --  8.9  --  8.3*  --  8.8* 8.9  MG 2.2  --   --   --   --   --   --  1.9   < > = values in this interval not displayed.   Liver Function Tests: Recent Labs  Lab 03/27/19 1410  AST 12*  ALT 12  ALKPHOS 172*  BILITOT 0.7  PROT 8.9*  ALBUMIN 3.9   No results for input(s): LIPASE, AMYLASE in the last 168 hours. No results for input(s): AMMONIA in the last 168 hours. CBC: Recent Labs  Lab 03/27/19 1410 03/28/19 0318  WBC 8.1 8.3  NEUTROABS 6.3  --   HGB 16.3 13.2  HCT 50.2 40.6  MCV 90.5 89.2  PLT 263 203   Cardiac Enzymes: No results for input(s): CKTOTAL, CKMB, CKMBINDEX, TROPONINI in the last 168 hours. BNP: Invalid input(s): POCBNP CBG: Recent Labs  Lab 03/31/19 1607 03/31/19 2144 04/01/19 0555 04/01/19 0738 04/01/19 1157  GLUCAP 213* 344* 225* 190* 292*    Time coordinating discharge:  36 minutes  Signed:  Catarina Hartshorn, DO [facility] Pager: [phone] 04/01/2019, 2:26 PM

## 2019-04-01 NOTE — TOC Progression Note (Signed)
Transition of Care [facility]) - Progression Note    Patient Details  Name: Darren West MRN: [id number] Date of Birth: 04/05/1955  Transition of [facility]) CM/SW Contact  Ihor Gully, LCSW Phone Number: 04/01/2019, 12:05 PM  Clinical Narrative:    Met with patient and discussed discharge plan. Patient was again offered [facility] or ALF referral. Patient declined. Discussed patient going to the [employer] in [place] [facility]) where he can get support due to experiencing homelessness. Patient asked if he was inquired by law to work with LCSW. Patient stated "I'm not going to [facility]." Inquired about the friend mentioned yesterday as a discharge option. Patient declined to provide contact information to family/friends. Patient stated that he had already called and appealed his discharge. It was pointed out that patient had not yet been discharged. LCSW inquired as to why patient was appealing discharge and had not been discharged and patient asked LCSW to leave his room. Report made to Netta Cedars with [facility] DSS/APS. Rip Harbour advised that she would submit report but was unsure if it met state standards for a case being opened.       Expected Discharge Plan: [place] Barriers to Discharge: Continued Medical Work up  Expected Discharge Plan and Services Expected Discharge Plan: [place]: [place] arrangements for the past 2 months: Hotel/Motel                                       Social Determinants of Health (SDOH) Interventions    Readmission Risk Interventions No flowsheet data found.

## 2019-04-01 NOTE — Care Management Important Message (Signed)
Important Message  Patient Details  Name: Darren West MRN: [id number] Date of Birth: April 14, 1955   Medicare Important Message Given:  Yes     Corey Harold 04/01/2019, 4:26 PM

## 2019-04-02 DIAGNOSIS — E08621 Diabetes mellitus due to underlying condition with foot ulcer: Secondary | ICD-10-CM

## 2019-04-02 LAB — GLUCOSE, CAPILLARY
Glucose-Capillary: 387 mg/dL — ABNORMAL HIGH (ref 70–99)
Glucose-Capillary: 204 mg/dL — ABNORMAL HIGH (ref 70–99)

## 2019-04-02 NOTE — TOC Transition Note (Signed)
Transition of Care [facility]) - CM/SW Discharge Note   Patient Details  Name: Darren West MRN: [id number] Date of Birth: 1955/01/23  Transition of Care [facility]) CM/SW Contact:  Ewing Schlein, LCSW Phone Number: 04/02/2019, 1:47 PM   Clinical Narrative: Provided patient with HINN-12 and Detailed Notice of Discharge. Explained forms to patient and patient signed form. RN notified CSW patient changed his hotel reservation to today. CSW discussed taxi voucher with [facility], [place].  4:07pm: [employer] called to set up transportation. TOC signing off.   Final next level of care: Other (comment)([facility] in [place] (motel)) Barriers to Discharge: Barriers Resolved   Patient Goals and CMS Choice   CMS Medicare.gov Compare Post Acute Care list provided to:: Patient Choice offered to / list presented to : Patient  Discharge Placement [facility] in [place]  Discharge Plan and Services     Post Acute Care Choice: Skilled Nursing Facility             Readmission Risk Interventions No flowsheet data found.

## 2019-04-02 NOTE — Progress Notes (Signed)
Physical Therapy Treatment Patient Details Name: Darren West MRN: [id number] DOB: [birth year] Today's Date: 04/02/2019    History of Present Illness Darren West is a 64 y.o. male. Patient brought in from hotel shelter.  Where he was placed for as being homeless following discharge from behavioral health.  Was only able to stay there for 3 days.  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  Patient reportedly fell but also had altered mental status.  Patient does present confused.  Has redness to his left foot, concern for infection there.    PT Comments    Patient demonstrates good return for sitting up at bedside and donning RLE BKA prosthetic leg.  Patient limited to a few steps at bedside due to fatigue, rolled himself into bathroom and transferred to commode using grab bar and tolerated sitting up at bedside after therapy with NT in room.  Patient will benefit from continued physical therapy in hospital and recommended venue below to increase strength, balance, endurance for safe ADLs and gait.   Follow Up Recommendations  SNF;Supervision for mobility/OOB;Supervision - Intermittent     Equipment Recommendations  None recommended by PT    Recommendations for Other Services       Precautions / Restrictions Precautions Precautions: Fall Precaution Comments: Rt BKA, left plantar foot wound; per patient left rotator cuff tear. Restrictions Weight Bearing Restrictions: No    Mobility  Bed Mobility Overal bed mobility: Modified Independent             General bed mobility comments: increased time; use of bedrail  Transfers Overall transfer level: Needs assistance Equipment used: Rolling walker (2 wheeled) Transfers: Sit to/from [employer] Sit to Stand: Min guard Stand pivot transfers: Min guard       General transfer comment: Demonstrates good return for donning Right BKA prosthetic leg  and able to transfer to wheelchair and commode in bathoom with Min guard assistance using armrest of w/c and grab bar in bathroom  Ambulation/Gait Ambulation/Gait assistance: Min assist;Min guard Gait Distance (Feet): 4 Feet Assistive device: Rolling walker (2 wheeled) Gait Pattern/deviations: Decreased step length - right;Decreased step length - left;Decreased stride length Gait velocity: decreased   General Gait Details: limited to 4-5 slow unsteady labored steps at bedside due to c/o fatigue   Stairs             Wheelchair Mobility    Modified Rankin (Stroke Patients Only)       Balance Overall balance assessment: Needs assistance Sitting-balance support: Feet supported;No upper extremity supported Sitting balance-Leahy Scale: Good Sitting balance - Comments: seated at EOB   Standing balance support: During functional activity;Bilateral upper extremity supported Standing balance-Leahy Scale: Fair Standing balance comment: using RW                            Cognition Arousal/Alertness: Awake/alert Behavior During Therapy: WFL for tasks assessed/performed Overall Cognitive Status: Within Functional Limits for tasks assessed                                        Exercises      General Comments        Pertinent Vitals/Pain Pain Assessment: Faces Faces Pain Scale: Hurts a little bit Pain Location: left shoulder Pain Descriptors / Indicators: Sore Pain Intervention(s): Limited  activity within patient's tolerance;Monitored during session    Home Living                      Prior Function            PT Goals (current goals can now be found in the care plan section) Acute Rehab PT Goals Patient Stated Goal: transfer to his wheelchair safely and walk with RW PT Goal Formulation: With patient Time For Goal Achievement: 04/11/19 Potential to Achieve Goals: Fair Progress towards PT goals: Progressing toward goals     Frequency    Min 3X/week      PT Plan Current plan remains appropriate    Co-evaluation              AM-PAC PT "6 Clicks" Mobility   Outcome Measure  Help needed turning from your back to your side while in a flat bed without using bedrails?: None Help needed moving from lying on your back to sitting on the side of a flat bed without using bedrails?: None Help needed moving to and from a bed to a chair (including a wheelchair)?: A Little Help needed standing up from a chair using your arms (e.g., wheelchair or bedside chair)?: A Little Help needed to walk in hospital room?: A Little Help needed climbing 3-5 steps with a railing? : A Lot 6 Click Score: 19    End of Session   Activity Tolerance: Patient tolerated treatment well;Patient limited by fatigue Patient left: in bed;with call bell/phone within reach;with nursing/sitter in room(seated at bedside) Nurse Communication: Mobility status PT Visit Diagnosis: Unsteadiness on feet (R26.81);History of falling (Z91.81);Muscle weakness (generalized) (M62.81);Difficulty in walking, not elsewhere classified (R26.2);Other abnormalities of gait and mobility (R26.89);Pain     Time: 1055-1130 PT Time Calculation (min) (ACUTE ONLY): 35 min  Charges:  $Therapeutic Exercise: 8-22 mins $Therapeutic Activity: 8-22 mins                     2:26 PM, 04/02/19 Darren West, MPT Physical Therapist with [facility] [phone] office [phone] mobile phone

## 2019-04-02 NOTE — Progress Notes (Addendum)
PROGRESS NOTE  Darren West JXB:[id number] DOB: Apr 01, 1955 DOA: 03/27/2019 PCP: Patient, No Pcp Per  Brief History:  64 year old male with a history of diabetes mellitus, hypertension, schizophrenia, right BKA presenting from hotel shelter secondary to mental status change. The patient was recently hospitalized at behavioral health from 03/19/2019 to 03/22/2019 secondary to suicidal ideation. He was discharged to hotel where he stayed for approximately 3 days. Apparently, the patient slid out of his wheelchair and since then has been confused and more lethargic. In the emergency department, the patient had a serum glucose of 733 with bicarbonate 29. He was treated for a nonketotic hyperosmolar state and started on IV fluids and IV insulin. He was subsequently transitioned to subcutaneous insulin. The patient was started on IV antibiotics for presumptive left lower extremity cellulitis/infected diabetic foot ulcer. The patient's mental status improved. He remained afebrile and hemodynamically stable.  The patient improved clinically and remained afebrile and hemodynamically stable.  The patient was transitioned to oral antibiotics.  Social work continue to work with the patient given his difficult social situation.  The patient was offered multiple resources to assist with his disposition and discharge due to his social situation.  The patient refused all attempts to assist in his disposition and medical care thereafter.  Assessment/Plan: Nonketotic hyperosmolar state -patient started on IV insulin with q 1 hour CBG check and q 4 hour BMPs -pt started on aggressive fluid resuscitation -Electrolytes were monitored and repleted -transitioned to [place] insulin once anion gap closed -diet was advanced once anion gap closed -HbA1C--12.0  Uncontrolled diabetes mellitus type 2 with hyperglycemia -03/28/2019 hemoglobin A1c 12.0 -Increase Levemir to 22 units twice daily -IncreaseNovoLog  12units with meals -Holding metformin--restart after discharge  Sepsis -present on admission -pt had tachycardia, elevated lactate, altered mental status -due to cellulitis -sepsis physiology resolved now  Cellulitis left lower extremity/diabetic foot ulcer -Improved on IV antibiotics -Transition to oral antibiotics--amox/clav and doxy x 7 more days -03/29/2019 ABI--LLE--1.03 -03/29/2019 ABI--RLE--triphasic waves at the thigh -3/25--foot wound looks clean  Acute metabolic encephalopathy -Secondary to [facility] -Improved--back to baseline -CT brain negative -UA negative for pyuria  Transient atrial fibrillation -The patient had transient episode of atrial fibrillation -Given acute medical stress and short duration, do not plan to start anticoagulation -Continue carvedilol -03/28/2019 echo EF 60-65%, no WMA, grade 1 DD  Essential hypertension -Continue carvedilol -controlled  Pulmonary infiltrate -Personally reviewed chest x-ray--bibasilar atelectasis -Patient is stable on room air without dyspnea  BPH -Continue tamsulosin and finasteride -Patient reports no urinary retention symptoms  Depression -Continue Cymbalta -Patient has been cleared by TTS -No hallucinations or suicidal ideation  Right BKA status -PT eval--> SNF, pt refuses.  Also unable to place due to difficulty with insurance  Homelessness -multiple resources and referrals have been given to patient and made for patient all of which he has refused. -[facility] referral made by social work     Disposition Plan:  Medically Stable for discharge Patient From: Homeless/Hotel D/C Place: [facility] Barriers: patient refusal of all resources  Family Communication:  No Family at bedside  Consultants:  General surgery  Code Status:  FULL   DVT Prophylaxis:  [place] Lovenox   Procedures: As Listed in Progress Note Above  Antibiotics: Vanco 3/20>>> cefepime/flagyl 3/20>>>3/24 Ceftriaxone 3/24>>>3/25 Amox/clav  3/25>>> Doxy3/25>>>      Subjective: Patient denies fevers, chills, headache, chest pain, dyspnea, nausea, vomiting, diarrhea, abdominal pain, dysuria, hematuria, hematochezia, and melena.   Objective: Vitals:  04/01/19 1415 04/01/19 2114 04/02/19 0538 04/02/19 0829  BP:  (!) 132/105 (!) 153/96   Pulse:  100 89   Resp:  20    Temp: 98.7 F (37.1 C) 99.2 F (37.3 C) 98.9 F (37.2 C)   TempSrc:  Oral Oral   SpO2:  93% 98% 97%  Weight:      Height:        Intake/Output Summary (Last 24 hours) at 04/02/2019 1141 Last data filed at 04/02/2019 0900 Gross per 24 hour  Intake 640 ml  Output 2975 ml  Net -2335 ml   Weight change:  Exam:   General:  Pt is alert, follows commands appropriately, not in acute distress  HEENT: No icterus, No thrush, No neck mass, [place]/AT  Cardiovascular: RRR, S1/S2, no rubs, no gallops  Respiratory: CTA bilaterally, no wheezing, no crackles, no rhonchi  Abdomen: Soft/+BS, non tender, non distended, no guarding  Extremities: No edema, No lymphangitis, No petechiae, No rashes, no synovitis   Data Reviewed: I have personally reviewed following labs and imaging studies Basic Metabolic Panel: Recent Labs  Lab 03/27/19 1410 03/27/19 1910 03/28/19 0318 03/30/19 0431 04/01/19 0944  NA 146* 149* 147* 142 138  K 3.9 3.3* 3.4* 4.2 4.1  CL 104 112* 114* 104 99  CO2 29 26 26 31 30   GLUCOSE 733* 459* 212* 245* 296*  BUN 27* 23 18 13 16   CREATININE 1.17 1.10 0.90 0.78 0.82  CALCIUM 9.9 8.9 8.3* 8.8* 8.9  MG 2.2  --   --   --  1.9   Liver Function Tests: Recent Labs  Lab 03/27/19 1410  AST 12*  ALT 12  ALKPHOS 172*  BILITOT 0.7  PROT 8.9*  ALBUMIN 3.9   No results for input(s): LIPASE, AMYLASE in the last 168 hours. No results for input(s): AMMONIA in the last 168 hours. Coagulation Profile: Recent Labs  Lab 03/27/19 1410  INR 1.1   CBC: Recent Labs  Lab 03/27/19 1410 03/28/19 0318 04/01/19 1509  WBC 8.1 8.3 8.9   NEUTROABS 6.3  --   --   HGB 16.3 13.2 14.6  HCT 50.2 40.6 44.7  MCV 90.5 89.2 88.0  PLT 263 203 188   Cardiac Enzymes: No results for input(s): CKTOTAL, CKMB, CKMBINDEX, TROPONINI in the last 168 hours. BNP: Invalid input(s): POCBNP CBG: Recent Labs  Lab 04/01/19 1638 04/01/19 1705 04/01/19 2026 04/02/19 0716 04/02/19 1133  GLUCAP 66* 113* 307* 387* 204*   HbA1C: No results for input(s): HGBA1C in the last 72 hours. Urine analysis:    Component Value Date/Time   COLORURINE STRAW (A) 03/27/2019 1341   APPEARANCEUR CLEAR 03/27/2019 1341   LABSPEC 1.031 (H) 03/27/2019 1341   PHURINE 6.0 03/27/2019 1341   GLUCOSEU >=500 (A) 03/27/2019 1341   HGBUR SMALL (A) 03/27/2019 1341   BILIRUBINUR NEGATIVE 03/27/2019 1341   KETONESUR 5 (A) 03/27/2019 1341   PROTEINUR 100 (A) 03/27/2019 1341   NITRITE NEGATIVE 03/27/2019 1341   LEUKOCYTESUR NEGATIVE 03/27/2019 1341   Sepsis Labs: @LABRCNTIP (procalcitonin:4,lacticidven:4) ) Recent Results (from the past 240 hour(s))  Urine culture     Status: Abnormal   Collection Time: 03/27/19  1:41 PM   Specimen: In/Out Cath Urine  Result Value Ref Range Status   Specimen Description   Final    IN/OUT CATH URINE Performed at [facility], [address] [place], [facility] [address]    Special Requests   Final    NONE Performed at [facility], [address] [place], [place] [address]    Culture MULTIPLE SPECIES PRESENT, SUGGEST RECOLLECTION (A)  Final   Report Status 03/29/2019 FINAL  Final  Blood Culture (routine x 2)     Status: None   Collection Time: 03/27/19  1:46 PM   Specimen: BLOOD RIGHT HAND  Result Value Ref Range Status   Specimen Description   Final    BLOOD RIGHT HAND BOTTLES DRAWN AEROBIC AND ANAEROBIC   Special Requests   Final    Blood Culture results may not be optimal due to an inadequate volume of blood received in culture bottles   Culture   Final    NO GROWTH 5 DAYS Performed at [facility], [address] [place], [place] [address]    Report Status 04/01/2019 FINAL  Final  Blood Culture (routine x 2)     Status: None   Collection Time: 03/27/19  2:23 PM   Specimen: BLOOD RIGHT WRIST  Result Value Ref Range Status   Specimen Description   Final    BLOOD RIGHT WRIST BOTTLES DRAWN AEROBIC AND ANAEROBIC   Special Requests   Final    Blood Culture results may not be optimal due to an inadequate volume of blood received in culture bottles   Culture   Final    NO GROWTH 5 DAYS Performed at [facility], [address] [place], [place] [address]    Report Status 04/01/2019 FINAL  Final  Respiratory Panel by RT PCR (Flu A&B, Covid) - Nasopharyngeal Swab     Status: None   Collection Time: 03/27/19  3:15 PM   Specimen: Nasopharyngeal Swab  Result Value Ref Range Status   SARS Coronavirus 2 by RT PCR NEGATIVE NEGATIVE Final    Comment: (NOTE) SARS-CoV-2 target nucleic acids are NOT DETECTED. The SARS-CoV-2 RNA is generally detectable in upper respiratoy specimens during the acute phase of infection. The lowest concentration of SARS-CoV-2 viral copies this assay can detect is 131 copies/mL. A negative result does not preclude SARS-Cov-2 infection and should not be used as the sole basis for treatment or other patient management decisions. A negative result may occur with  improper specimen collection/handling, submission of specimen other than nasopharyngeal swab, presence of viral mutation(s) within the areas targeted by this assay, and inadequate number of viral copies (<131 copies/mL). A negative result must be combined with clinical observations, patient history, and epidemiological information. The expected result is Negative. Fact Sheet for Patients:  https://www.moore.com/https://www.fda.gov/media/142436/download Fact Sheet for Healthcare Providers:  https://www.young.biz/https://www.fda.gov/media/142435/download This test is not yet ap proved or cleared by the [place] FDA and  has been authorized for detection and/or  diagnosis of SARS-CoV-2 by FDA under an Emergency Use Authorization (EUA). This EUA will remain  in effect (meaning this test can be used) for the duration of the COVID-19 declaration under Section 564(b)(1) of the Act, 21 U.S.C. section 360bbb-3(b)(1), unless the authorization is terminated or revoked sooner.    Influenza A by PCR NEGATIVE NEGATIVE Final   Influenza B by PCR NEGATIVE NEGATIVE Final    Comment: (NOTE) The Xpert Xpress SARS-CoV-2/FLU/RSV assay is intended as an aid in  the diagnosis of influenza from Nasopharyngeal swab specimens and  should not be used as a sole basis for treatment. Nasal washings and  aspirates are unacceptable for Xpert Xpress SARS-CoV-2/FLU/RSV  testing. Fact Sheet for Patients: https://www.moore.com/https://www.fda.gov/media/142436/download Fact Sheet for Healthcare Providers: https://www.young.biz/https://www.fda.gov/media/142435/download This test is not yet approved or cleared by the [place] FDA and  has been authorized for  detection and/or diagnosis of SARS-CoV-2 by  FDA under an Emergency Use Authorization (EUA). This EUA will remain  in effect (meaning this test can be used) for the duration of the  Covid-19 declaration under Section 564(b)(1) of the Act, 21  U.S.C. section 360bbb-3(b)(1), unless the authorization is  terminated or revoked. Performed at [facility], [address], [place], [place] [address]   MRSA PCR Screening     Status: None   Collection Time: 03/27/19  4:57 PM   Specimen: Nasal Mucosa; Nasopharyngeal  Result Value Ref Range Status   MRSA by PCR NEGATIVE NEGATIVE Final    Comment:        The GeneXpert MRSA Assay (FDA approved for NASAL specimens only), is one component of a comprehensive MRSA colonization surveillance program. It is not intended to diagnose MRSA infection nor to guide or monitor treatment for MRSA infections. Performed at [facility], [address], [place], [place] [address]      Scheduled Meds: . amoxicillin-clavulanate  1  tablet Oral Q12H  . carvedilol  12.5 mg Oral Q12H  . Chlorhexidine Gluconate Cloth  6 each Topical Q0600  . collagenase   Topical Daily  . doxycycline  100 mg Oral Q12H  . DULoxetine  30 mg Oral Daily  . enoxaparin (LOVENOX) injection  0.5 mg/kg Subcutaneous Q24H  . finasteride  5 mg Oral Daily  . gabapentin  200 mg Oral TID  . insulin aspart  0-15 Units Subcutaneous TID WC  . insulin aspart  0-5 Units Subcutaneous QHS  . insulin aspart  12 Units Subcutaneous TID WC  . insulin detemir  22 Units Subcutaneous BID  . tamsulosin  0.4 mg Oral QPC supper   Continuous Infusions:  Procedures/Studies: CT Head Wo Contrast  Result Date: 03/27/2019 CLINICAL DATA:  Altered mental status EXAM: CT HEAD WITHOUT CONTRAST TECHNIQUE: Contiguous axial images were obtained from the base of the skull through the vertex without intravenous contrast. COMPARISON:  None. FINDINGS: Brain: No evidence of acute infarction, hemorrhage, hydrocephalus, extra-axial collection or mass lesion/mass effect. Vascular: Atherosclerotic calcifications involving the large vessels of the skull base. No unexpected hyperdense vessel. Skull: Normal. Negative for fracture or focal lesion. Sinuses/Orbits: No acute finding. Other: Within the subcutaneous soft tissues of the posterior right neck is a rounded low attenuation structure measuring 1.8 x 1.6 x 1.8 cm (series 3, image 34; series 7, image 66). IMPRESSION: 1. No acute intracranial pathology. 2. Rounded low attenuation structure in the subcutaneous soft tissues of the posterior right neck measuring 1.8 cm. This may represent a sebaceous cyst or other cystic lesion. Correlate with physical exam. Electronically Signed   By: Duanne Guess D.O.   On: 03/27/2019 15:59   CT ABDOMEN PELVIS W CONTRAST  Result Date: 03/27/2019 CLINICAL DATA:  Abdominal pain. EXAM: CT ABDOMEN AND PELVIS WITH CONTRAST TECHNIQUE: Multidetector CT imaging of the abdomen and pelvis was performed using the  standard protocol following bolus administration of intravenous contrast. CONTRAST:  OMNIPAQUE IOHEXOL 300 MG/ML  SOLN COMPARISON:  None. FINDINGS: Lower chest: Mild bibasilar scarring and/or atelectasis is seen. Hepatobiliary: No focal liver abnormality is seen. No gallstones, gallbladder wall thickening, or biliary dilatation. Pancreas: Unremarkable. No pancreatic ductal dilatation or surrounding inflammatory changes. Spleen: Normal in size without focal abnormality. Adrenals/Urinary Tract: Adrenal glands are unremarkable. Kidneys are normal in size. A 4.7 cm cyst is seen along the lateral aspect of the mid right kidney. 3.5 cm, 3.8 cm and 4.3 cm cysts are seen within  the left kidney. A 6 mm nonobstructing renal stone is seen within the anterior aspect of the mid right kidney. A 5 mm nonobstructing renal stone is seen within the lower pole of the left kidney. There is no evidence of hydronephrosis or renal obstruction. Bladder is unremarkable. Stomach/Bowel: A 3.4 cm x 2.8 cm gastric diverticulum is seen along the posteromedial aspect of the body of the stomach. Appendix appears normal. No evidence of bowel wall thickening, distention, or inflammatory changes. Noninflamed diverticula are seen within the large bowel. Vascular/Lymphatic: No significant vascular findings are present. No enlarged abdominal or pelvic lymph nodes. Reproductive: Prostate is unremarkable. Other: No abdominal wall hernia or abnormality. No abdominopelvic ascites. Musculoskeletal: Multilevel degenerative changes seen within the lumbar spine. IMPRESSION: 1. Bilateral nonobstructing renal calculi. 2. Noninflamed gastric and colonic diverticula. 3. Multiple bilateral renal cysts. Electronically Signed   By: Aram Candela M.D.   On: 03/27/2019 18:09   US ARTERIAL SEG MULTIPLE LE (ABI, SEGMENTAL PRESSURES, PVR'S)  Result Date: 03/29/2019 CLINICAL DATA:  History of right BKA. Left foot redness with a wound. EXAM: NONINVASIVE  PHYSIOLOGIC VASCULAR STUDY OF BILATERAL LOWER EXTREMITIES TECHNIQUE: Non-invasive vascular evaluation of both lower extremities was performed at rest, including calculation of ankle-brachial indices, multiple segmental pressure evaluation, segmental Doppler and segmental pulse volume recording. COMPARISON:  None. FINDINGS: Right Lower Extremity Resting ABI:  Below the knee amputation Normal triphasic waveforms in the right thigh. Left Lower Extremity: Resting ABI: 1.03 Resting TBI: Not obtained Segmental Pressures: Normal segmental pressures, no significant (20 mmHg) pressure gradient between adjacent segments. Arterial Waveforms: Waveforms at the left common femoral artery are slightly irregular. Biphasic waveforms at the left SFA. Triphasic waveforms at the left popliteal artery. Triphasic waveforms at the left posterior tibial artery. Monophasic waveforms at the left dorsalis pedis artery. PVRs: Normal PVRs with maintained waveform amplitude and quality. Ankle Brachial index > 1.4 Non diagnostic secondary to incompressible vessel calcifications 1.0-1.4       Normal 0.9-0.99     Borderline PAD 0.8-0.89     Mild PAD 0.5-0.79     Moderate PAD < 0.5          Severe PAD IMPRESSION: Normal left ankle-brachial index, measuring 1.03. Left lower extremity waveforms are normal with exception of the left dorsalis pedis artery. Electronically Signed   By: Richarda Overlie M.D.   On: 03/29/2019 12:57   DG Chest Port 1 View  Result Date: 03/28/2019 CLINICAL DATA:  PICC line placement EXAM: PORTABLE CHEST 1 VIEW COMPARISON:  March 27, 2019 FINDINGS: The right-sided PICC line terminates over the SVC. The heart size is stable from prior study. There is some atelectasis at the lung bases without evidence for large focal infiltrate. There is no pneumothorax. No large pleural effusion. IMPRESSION: Well position right-sided PICC line. Otherwise stable appearance of the chest. Electronically Signed   By: Katherine Mantle M.D.   On:  03/28/2019 21:05   DG Chest Port 1 View  Result Date: 03/27/2019 CLINICAL DATA:  Altered mental status. Nonverbal. EXAM: PORTABLE CHEST 1 VIEW COMPARISON:  03/11/2019 FINDINGS: Low lung volumes. There is patchy infiltrate at the LEFT lung base, new since the prior study. No pulmonary edema. No pleural effusions. Remote resection of the distal LEFT clavicle. IMPRESSION: New LEFT lower lobe infiltrate. Electronically Signed   By: Norva Pavlov M.D.   On: 03/27/2019 14:24   DG Chest Port 1 View  Result Date: 03/11/2019 CLINICAL DATA:  Pt presents with EMS with c/o SI. Per  EMS, pt reports that he was at a hotel and hotel management wanted him gone. EMS reported that he told them he was SI when they attempted to make him leave. Pt is not very cooperative when an.*comment was truncated* EXAM: PORTABLE CHEST 1 VIEW COMPARISON:  None. FINDINGS: Normal mediastinum and cardiac silhouette. Low lung volumes. Normal pulmonary vasculature. No evidence of effusion, infiltrate, or pneumothorax. No acute bony abnormality. IMPRESSION: No acute cardiopulmonary process. Electronically Signed   By: Genevive Bi M.D.   On: 03/11/2019 13:39   DG Foot 2 Views Left  Result Date: 03/27/2019 CLINICAL DATA:  Nonverbal. Ulcer of the bottom of the LEFT foot. RIGHT BKA. EXAM: LEFT FOOT - 2 VIEW COMPARISON:  None. FINDINGS: Significant soft tissue swelling of the forefoot. Remote segmental resection or trauma of the 5th metatarsal. No acute fracture or subluxation. No radiopaque foreign body or soft tissue gas. Achilles calcaneal spur noted. IMPRESSION: Soft tissue swelling. No evidence for acute osseous abnormality. Electronically Signed   By: Norva Pavlov M.D.   On: 03/27/2019 14:26   ECHOCARDIOGRAM COMPLETE  Result Date: 03/28/2019    ECHOCARDIOGRAM REPORT   Patient Name:   JOHNMATTHEW SOLORIO Date of Exam: 03/28/2019 Medical Rec #:  [id number]    Height:       74.0 in Accession #:    [id number]   Weight:       265.2 lb Date of  Birth:  August 28, 1955     BSA:          2.450 m Patient Age:    63 years     BP:           154/101 mmHg Patient Gender: M            HR:           91 bpm. [facility] Procedure: 2D Echo, Cardiac Doppler and Color Doppler Indications:    Atrial Fibrillation 427.31 / I48.91  History:        Patient has no prior history of Echocardiogram examinations.                 Risk Factors:Hypertension and Diabetes. Schizophrenia (HCC)                 (From Hx).  Sonographer:    Celesta Gentile RCS Referring Phys: [phone] Heloise Beecham [facility]  Sonographer Comments: Somewhat difficult due to patient not cooperating and combative at times. IMPRESSIONS  1. Left ventricular ejection fraction, by estimation, is 60 to 65%. The left ventricle has normal function. The left ventricle has no regional wall motion abnormalities. Left ventricular diastolic parameters are consistent with Grade I diastolic dysfunction (impaired relaxation).  2. Right ventricular systolic function is normal. The right ventricular size is normal.  3. The mitral valve is normal in structure. No evidence of mitral valve regurgitation. No evidence of mitral stenosis.  4. The aortic valve is tricuspid. Aortic valve regurgitation is not visualized. Mild to moderate aortic valve sclerosis/calcification is present, without any evidence of aortic stenosis.  5. The inferior vena cava is normal in size with greater than 50% respiratory variability, suggesting right atrial pressure of 3 mmHg. FINDINGS  Left Ventricle: Left ventricular ejection fraction, by estimation, is 60 to 65%. The left ventricle has normal function. The left ventricle has no regional wall motion abnormalities. The left ventricular internal cavity size was normal in size. There is  no left ventricular hypertrophy. Left ventricular diastolic parameters are consistent with Grade  I diastolic dysfunction (impaired relaxation). Right Ventricle: The right ventricular size is normal. No increase in  right ventricular wall thickness. Right ventricular systolic function is normal. Left Atrium: Left atrial size was normal in size. Right Atrium: Right atrial size was normal in size. Pericardium: There is no evidence of pericardial effusion. Mitral Valve: The mitral valve is normal in structure. Normal mobility of the mitral valve leaflets. No evidence of mitral valve regurgitation. No evidence of mitral valve stenosis. Tricuspid Valve: The tricuspid valve is normal in structure. Tricuspid valve regurgitation is not demonstrated. No evidence of tricuspid stenosis. Aortic Valve: The aortic valve is tricuspid. . There is moderate thickening and moderate calcification of the aortic valve. Aortic valve regurgitation is not visualized. Mild to moderate aortic valve sclerosis/calcification is present, without any evidence of aortic stenosis. There is moderate thickening of the aortic valve. There is moderate calcification of the aortic valve. Pulmonic Valve: The pulmonic valve was normal in structure. Pulmonic valve regurgitation is not visualized. No evidence of pulmonic stenosis. Aorta: The aortic root is normal in size and structure. Venous: The inferior vena cava is normal in size with greater than 50% respiratory variability, suggesting right atrial pressure of 3 mmHg. IAS/Shunts: No atrial level shunt detected by color flow Doppler.  LEFT VENTRICLE PLAX 2D LVIDd:         5.26 cm  Diastology LVIDs:         3.22 cm  LV e' lateral:   10.20 cm/s LV PW:         1.12 cm  LV E/e' lateral: 11.4 LV IVS:        1.18 cm  LV e' medial:    7.07 cm/s LVOT diam:     1.90 cm  LV E/e' medial:  16.4 LV SV:         57 LV SV Index:   23 LVOT Area:     2.84 cm  RIGHT VENTRICLE RV Mid diam:    2.65 cm RV S prime:     10.40 cm/s TAPSE (M-mode): 2.2 cm LEFT ATRIUM             Index LA diam:        3.55 cm 1.45 cm/m LA Vol (A2C):   59.7 ml 24.36 ml/m LA Vol (A4C):   70.5 ml 28.77 ml/m LA Biplane Vol: 68.9 ml 28.12 ml/m  AORTIC VALVE  LVOT Vmax:   95.30 cm/s LVOT Vmean:  70.800 cm/s LVOT VTI:    0.201 m  AORTA Ao Root diam: 3.15 cm MITRAL VALVE MV Area (PHT): 3.17 cm     SHUNTS MV Decel Time: 239 msec     Systemic VTI:  0.20 m MV E velocity: 116.00 cm/s  Systemic Diam: 1.90 cm MV A velocity: 109.00 cm/s MV E/A ratio:  1.06 Candee Furbish MD Electronically signed by Candee Furbish MD Signature Date/Time: 03/28/2019/12:54:20 PM    Final    [place] EKG SITE RITE  Result Date: 03/28/2019 If Site Rite image not attached, placement could not be confirmed due to current cardiac rhythm.   Orson Eva, DO  [facility]  If 7PM-7AM, please contact night-coverage [email or web address] Password [facility] 04/02/2019, 11:41 AM   LOS: 6 days

## 2019-04-02 NOTE — TOC Progression Note (Signed)
Transition of Care [facility]) - Progression Note    Patient Details  Name: Darren West MRN: [id number] Date of Birth: Sep 04, 1955  Transition [facility]) CM/SW Contact  Corey Harold Phone Number: 04/02/2019, 1:13 PM  C          [employer] Detailed Notice of Discharge letter created and saved: Yes(done by Ervin Knack) Detailed Notice of Discharge Document Given to [employer]: Yes(given by Memory Argue, LCSW) [employer] ROI Document Created: Yes [employer] appeal documents uploaded to [employer] stite: Yes          S

## 2019-05-01 ENCOUNTER — Emergency Department (HOSPITAL_COMMUNITY): Payer: Medicare (Managed Care)

## 2019-05-01 ENCOUNTER — Encounter (HOSPITAL_COMMUNITY): Payer: Self-pay | Admitting: Emergency Medicine

## 2019-05-01 ENCOUNTER — Inpatient Hospital Stay (HOSPITAL_COMMUNITY)
Admission: EM | Admit: 2019-05-01 | Discharge: 2019-05-04 | DRG: 637 | Disposition: A | Discharge status: Home / Self Care | Payer: Medicare (Managed Care) | Attending: Internal Medicine | Admitting: Internal Medicine

## 2019-05-01 ENCOUNTER — Other Ambulatory Visit: Payer: Self-pay

## 2019-05-01 DIAGNOSIS — F329 Major depressive disorder, single episode, unspecified: Secondary | ICD-10-CM | POA: Diagnosis present

## 2019-05-01 DIAGNOSIS — E1140 Type 2 diabetes mellitus with diabetic neuropathy, unspecified: Secondary | ICD-10-CM | POA: Diagnosis present

## 2019-05-01 DIAGNOSIS — T383X5A Adverse effect of insulin and oral hypoglycemic [antidiabetic] drugs, initial encounter: Secondary | ICD-10-CM | POA: Diagnosis present

## 2019-05-01 DIAGNOSIS — Z9119 Patient's noncompliance with other medical treatment and regimen: Secondary | ICD-10-CM | POA: Diagnosis not present

## 2019-05-01 DIAGNOSIS — Z89511 Acquired absence of right leg below knee: Secondary | ICD-10-CM

## 2019-05-01 DIAGNOSIS — I119 Hypertensive heart disease without heart failure: Secondary | ICD-10-CM | POA: Diagnosis present

## 2019-05-01 DIAGNOSIS — G9341 Metabolic encephalopathy: Secondary | ICD-10-CM | POA: Diagnosis present

## 2019-05-01 DIAGNOSIS — IMO0002 Reserved for concepts with insufficient information to code with codable children: Secondary | ICD-10-CM | POA: Diagnosis present

## 2019-05-01 DIAGNOSIS — E872 Acidosis: Secondary | ICD-10-CM

## 2019-05-01 DIAGNOSIS — Z7951 Long term (current) use of inhaled steroids: Secondary | ICD-10-CM

## 2019-05-01 DIAGNOSIS — E1165 Type 2 diabetes mellitus with hyperglycemia: Secondary | ICD-10-CM | POA: Diagnosis not present

## 2019-05-01 DIAGNOSIS — T68XXXA Hypothermia, initial encounter: Secondary | ICD-10-CM | POA: Diagnosis present

## 2019-05-01 DIAGNOSIS — Z794 Long term (current) use of insulin: Secondary | ICD-10-CM

## 2019-05-01 DIAGNOSIS — T383X1A Poisoning by insulin and oral hypoglycemic [antidiabetic] drugs, accidental (unintentional), initial encounter: Secondary | ICD-10-CM | POA: Diagnosis present

## 2019-05-01 DIAGNOSIS — E11649 Type 2 diabetes mellitus with hypoglycemia without coma: Principal | ICD-10-CM | POA: Diagnosis present

## 2019-05-01 DIAGNOSIS — Z20822 Contact with and (suspected) exposure to covid-19: Secondary | ICD-10-CM | POA: Diagnosis present

## 2019-05-01 DIAGNOSIS — I10 Essential (primary) hypertension: Secondary | ICD-10-CM | POA: Diagnosis present

## 2019-05-01 DIAGNOSIS — F209 Schizophrenia, unspecified: Secondary | ICD-10-CM | POA: Diagnosis present

## 2019-05-01 DIAGNOSIS — Z79899 Other long term (current) drug therapy: Secondary | ICD-10-CM | POA: Diagnosis not present

## 2019-05-01 DIAGNOSIS — I482 Chronic atrial fibrillation, unspecified: Secondary | ICD-10-CM | POA: Diagnosis present

## 2019-05-01 DIAGNOSIS — E162 Hypoglycemia, unspecified: Secondary | ICD-10-CM | POA: Diagnosis not present

## 2019-05-01 DIAGNOSIS — E8729 Other acidosis: Secondary | ICD-10-CM

## 2019-05-01 DIAGNOSIS — E876 Hypokalemia: Secondary | ICD-10-CM | POA: Diagnosis present

## 2019-05-01 DIAGNOSIS — R4182 Altered mental status, unspecified: Secondary | ICD-10-CM | POA: Diagnosis present

## 2019-05-01 LAB — CBC WITH DIFFERENTIAL/PLATELET
Abs Immature Granulocytes: 0.09 10*3/uL — ABNORMAL HIGH (ref 0.00–0.07)
Basophils Absolute: 0 10*3/uL (ref 0.0–0.1)
Basophils Relative: 1 %
Eosinophils Absolute: 0.1 10*3/uL (ref 0.0–0.5)
Eosinophils Relative: 1 %
HCT: 43.5 % (ref 39.0–52.0)
Hemoglobin: 13.6 g/dL (ref 13.0–17.0)
Immature Granulocytes: 1 %
Lymphocytes Relative: 21 %
Lymphs Abs: 1.6 10*3/uL (ref 0.7–4.0)
MCH: 29 pg (ref 26.0–34.0)
MCHC: 31.3 g/dL (ref 30.0–36.0)
MCV: 92.8 fL (ref 80.0–100.0)
Monocytes Absolute: 0.6 10*3/uL (ref 0.1–1.0)
Monocytes Relative: 8 %
Neutro Abs: 5.2 10*3/uL (ref 1.7–7.7)
Neutrophils Relative %: 68 %
Platelets: 299 10*3/uL (ref 150–400)
RBC: 4.69 MIL/uL (ref 4.22–5.81)
RDW: 13.3 % (ref 11.5–15.5)
WBC: 7.7 10*3/uL (ref 4.0–10.5)
nRBC: 0 % (ref 0.0–0.2)

## 2019-05-01 LAB — BLOOD GAS, ARTERIAL
Acid-Base Excess: 2.7 mmol/L — ABNORMAL HIGH (ref 0.0–2.0)
Acid-Base Excess: 3.4 mmol/L — ABNORMAL HIGH (ref 0.0–2.0)
Bicarbonate: 25.4 mmol/L (ref 20.0–28.0)
Bicarbonate: 26 mmol/L (ref 20.0–28.0)
FIO2: 32
FIO2: 35
O2 Saturation: 94.4 %
O2 Saturation: 97.7 %
Patient temperature: 36.4
Patient temperature: 35.3
pCO2 arterial: 64.3 mmHg — ABNORMAL HIGH (ref 32.0–48.0)
pCO2 arterial: 64.5 mmHg — ABNORMAL HIGH (ref 32.0–48.0)
pH, Arterial: 7.276 — ABNORMAL LOW (ref 7.350–7.450)
pH, Arterial: 7.283 — ABNORMAL LOW (ref 7.350–7.450)
pO2, Arterial: 81.1 mmHg — ABNORMAL LOW (ref 83.0–108.0)
pO2, Arterial: 115 mmHg — ABNORMAL HIGH (ref 83.0–108.0)

## 2019-05-01 LAB — CBG MONITORING, ED
Glucose-Capillary: 130 mg/dL — ABNORMAL HIGH (ref 70–99)
Glucose-Capillary: 22 mg/dL — CL (ref 70–99)
Glucose-Capillary: 59 mg/dL — ABNORMAL LOW (ref 70–99)
Glucose-Capillary: 58 mg/dL — ABNORMAL LOW (ref 70–99)
Glucose-Capillary: 88 mg/dL (ref 70–99)
Glucose-Capillary: 58 mg/dL — ABNORMAL LOW (ref 70–99)
Glucose-Capillary: 107 mg/dL — ABNORMAL HIGH (ref 70–99)
Glucose-Capillary: 86 mg/dL (ref 70–99)
Glucose-Capillary: 106 mg/dL — ABNORMAL HIGH (ref 70–99)
Glucose-Capillary: 80 mg/dL (ref 70–99)
Glucose-Capillary: 94 mg/dL (ref 70–99)
Glucose-Capillary: 82 mg/dL (ref 70–99)

## 2019-05-01 LAB — COMPREHENSIVE METABOLIC PANEL
ALT: 20 U/L (ref 0–44)
AST: 27 U/L (ref 15–41)
Albumin: 3.1 g/dL — ABNORMAL LOW (ref 3.5–5.0)
Alkaline Phosphatase: 120 U/L (ref 38–126)
Anion gap: 7 (ref 5–15)
BUN: 22 mg/dL (ref 8–23)
CO2: 27 mmol/L (ref 22–32)
Calcium: 8.7 mg/dL — ABNORMAL LOW (ref 8.9–10.3)
Chloride: 108 mmol/L (ref 98–111)
Creatinine, Ser: 0.8 mg/dL (ref 0.61–1.24)
GFR calc Af Amer: >60 mL/min (ref >60)
GFR calc non Af Amer: >60 mL/min (ref >60)
Glucose, Bld: 25 mg/dL — CL (ref 70–99)
Potassium: 3.4 mmol/L — ABNORMAL LOW (ref 3.5–5.1)
Sodium: 142 mmol/L (ref 135–145)
Total Bilirubin: 0.4 mg/dL (ref 0.3–1.2)
Total Protein: 7.6 g/dL (ref 6.5–8.1)

## 2019-05-01 LAB — URINALYSIS, COMPLETE (UACMP) WITH MICROSCOPIC
Bilirubin Urine: NEGATIVE
Glucose, UA: NEGATIVE mg/dL
Hgb urine dipstick: NEGATIVE
Ketones, ur: NEGATIVE mg/dL
Leukocytes,Ua: NEGATIVE
Nitrite: NEGATIVE
Protein, ur: >=300 mg/dL — AB
Specific Gravity, Urine: 1.025 (ref 1.005–1.030)
pH: 5 (ref 5.0–8.0)

## 2019-05-01 LAB — SALICYLATE LEVEL: Salicylate Lvl: <7 mg/dL — ABNORMAL LOW (ref 7.0–30.0)

## 2019-05-01 LAB — LACTIC ACID, PLASMA: Lactic Acid, Venous: 0.8 mmol/L (ref 0.5–1.9)

## 2019-05-01 LAB — ETHANOL: Alcohol, Ethyl (B): <10 mg/dL (ref <10)

## 2019-05-01 LAB — RESPIRATORY PANEL BY RT PCR (FLU A&B, COVID)
Influenza A by PCR: NEGATIVE
Influenza B by PCR: NEGATIVE
SARS Coronavirus 2 by RT PCR: NEGATIVE

## 2019-05-01 LAB — POC SARS CORONAVIRUS 2 AG -  ED: SARS Coronavirus 2 Ag: NEGATIVE

## 2019-05-01 LAB — AMMONIA: Ammonia: 24 umol/L (ref 9–35)

## 2019-05-01 LAB — RAPID URINE DRUG SCREEN, HOSP PERFORMED
Amphetamines: NOT DETECTED
Barbiturates: NOT DETECTED
Benzodiazepines: NOT DETECTED
Cocaine: NOT DETECTED
Opiates: NOT DETECTED
Tetrahydrocannabinol: NOT DETECTED

## 2019-05-01 LAB — ACETAMINOPHEN LEVEL: Acetaminophen (Tylenol), Serum: 13 ug/mL (ref 10–30)

## 2019-05-01 LAB — MAGNESIUM: Magnesium: 2.2 mg/dL (ref 1.7–2.4)

## 2019-05-01 MED ORDER — DEXTROSE 50 % IV SOLN
INTRAVENOUS | Status: AC
  Administered 2019-05-01: 16:00:00 50 mL via INTRAVENOUS
  Filled 2019-05-01: qty 50

## 2019-05-01 MED ORDER — INSULIN ASPART 100 UNIT/ML ~~LOC~~ SOLN
0.0000 [IU] | SUBCUTANEOUS | Status: DC
  Administered 2019-05-02 – 2019-05-03 (×2): 2 [IU] via SUBCUTANEOUS
  Administered 2019-05-03 – 2019-05-04 (×2): 3 [IU] via SUBCUTANEOUS
  Administered 2019-05-04: 08:00:00 2 [IU] via SUBCUTANEOUS

## 2019-05-01 MED ORDER — DEXTROSE 50 % IV SOLN
12.5000 g | INTRAVENOUS | Status: AC
  Administered 2019-05-01: 16:00:00 12.5 g via INTRAVENOUS

## 2019-05-01 MED ORDER — ISOSORBIDE MONONITRATE ER 60 MG PO TB24
30.0000 mg | ORAL_TABLET | Freq: Every day | ORAL | Status: DC
  Administered 2019-05-02 – 2019-05-04 (×3): 30 mg via ORAL
  Filled 2019-05-01 (×3): qty 1

## 2019-05-01 MED ORDER — CARVEDILOL 12.5 MG PO TABS
12.5000 mg | ORAL_TABLET | Freq: Two times a day (BID) | ORAL | Status: DC
  Administered 2019-05-02 – 2019-05-04 (×5): 12.5 mg via ORAL
  Filled 2019-05-01 (×5): qty 1

## 2019-05-01 MED ORDER — DEXTROSE 50 % IV SOLN
INTRAVENOUS | Status: AC
  Administered 2019-05-01: 17:00:00 12.5 g via INTRAVENOUS
  Filled 2019-05-01: qty 50

## 2019-05-01 MED ORDER — ENOXAPARIN SODIUM 40 MG/0.4ML ~~LOC~~ SOLN
40.0000 mg | Freq: Every day | SUBCUTANEOUS | Status: DC
  Administered 2019-05-01 – 2019-05-03 (×3): 40 mg via SUBCUTANEOUS
  Filled 2019-05-01 (×3): qty 0.4

## 2019-05-01 MED ORDER — POLYETHYLENE GLYCOL 3350 17 G PO PACK
17.0000 g | PACK | Freq: Every day | ORAL | Status: DC | PRN
  Administered 2019-05-02: 15:00:00 17 g via ORAL
  Filled 2019-05-01: qty 1

## 2019-05-01 MED ORDER — ONDANSETRON HCL 4 MG PO TABS: 4.0000 mg | ORAL_TABLET | Freq: Four times a day (QID) | ORAL | Status: DC | PRN

## 2019-05-01 MED ORDER — DEXTROSE 10 % IV SOLN: Freq: Once | INTRAVENOUS | Status: AC

## 2019-05-01 MED ORDER — DEXTROSE 10 % IV SOLN: INTRAVENOUS | Status: DC

## 2019-05-01 MED ORDER — DEXTROSE 50 % IV SOLN
25.0000 g | INTRAVENOUS | Status: AC
  Administered 2019-05-01: 25 g via INTRAVENOUS
  Filled 2019-05-01: qty 50

## 2019-05-01 MED ORDER — ONDANSETRON HCL 4 MG/2ML IJ SOLN: 4.0000 mg | Freq: Four times a day (QID) | INTRAMUSCULAR | Status: DC | PRN

## 2019-05-01 MED ORDER — THIAMINE HCL 100 MG/ML IJ SOLN
100.0000 mg | Freq: Once | INTRAMUSCULAR | Status: AC
  Administered 2019-05-01: 16:00:00 100 mg via INTRAVENOUS
  Filled 2019-05-01: qty 2

## 2019-05-01 MED ORDER — IPRATROPIUM-ALBUTEROL 0.5-2.5 (3) MG/3ML IN SOLN: 3.0000 mL | RESPIRATORY_TRACT | Status: DC | PRN

## 2019-05-01 MED ORDER — DEXTROSE 50 % IV SOLN
INTRAVENOUS | Status: AC
  Filled 2019-05-01: qty 50

## 2019-05-01 MED ORDER — FLUTICASONE FUROATE-VILANTEROL 200-25 MCG/INH IN AEPB
1.0000 | INHALATION_SPRAY | Freq: Every day | RESPIRATORY_TRACT | Status: DC
  Administered 2019-05-02 – 2019-05-04 (×3): 1 via RESPIRATORY_TRACT
  Filled 2019-05-01: qty 28

## 2019-05-01 MED ORDER — POTASSIUM CHLORIDE 10 MEQ/100ML IV SOLN
10.0000 meq | INTRAVENOUS | Status: AC
  Administered 2019-05-01 – 2019-05-02 (×3): 10 meq via INTRAVENOUS
  Filled 2019-05-01 (×3): qty 100

## 2019-05-01 MED ORDER — DEXTROSE 50 % IV SOLN: 12.5000 g | INTRAVENOUS | Status: AC

## 2019-05-01 NOTE — ED Provider Notes (Signed)
[facility] EMERGENCY DEPARTMENT Provider Note   CSN: [id number] Arrival date & time: 05/01/19  1523   Level 5 caveat: Altered mental status  History Chief Complaint  Patient presents with  . Loss of Consciousness    Darren West is a 64 y.o. male.  HPI   Patient presents to the ED for evaluation of altered mental status.  Patient was found in a hotel unconscious.  EMS was contacted.  They noted his oxygen saturation was 88% on room air.  EMS reported that they found pill bottles on the floor but they did not bring any of those in.  In the ED the patient is minimally responsive.  He is not able to provide any history.  Past Medical History:  Diagnosis Date  . Below-knee amputation of right lower extremity (HCC)   . Diabetes mellitus without complication (HCC)   . Frequent urination   . Hypertension   . Neuropathy   . Schizophrenia [facility])     Patient Active Problem List   Diagnosis Date Noted  . Atrial fibrillation with RVR (HCC)   . Uncontrolled type 2 diabetes mellitus with hyperglycemia, with long-term current use of insulin (HCC) 03/31/2019  . Cellulitis of foot, left   . Hyperglycemia 03/27/2019  . Uncontrolled diabetes mellitus (HCC) 03/27/2019  . Essential hypertension 03/27/2019  . S/P BKA (below knee amputation) unilateral, right (HCC) 03/27/2019  . HCAP (healthcare-associated pneumonia) 03/27/2019  . Diabetic ulcer of left foot (HCC) 03/27/2019  . Homelessness 03/12/2019    Past Surgical History:  Procedure Laterality Date  . BELOW KNEE LEG AMPUTATION         No family history on file.  Social History   Tobacco Use  . Smoking status: Never Smoker  . Smokeless tobacco: Never Used  Substance Use Topics  . Alcohol use: Never  . Drug use: Never    Home Medications Prior to Admission medications   Medication Sig Start Date End Date Taking? Authorizing Provider  acetaminophen (TYLENOL) 500 MG tablet Take 500 mg by mouth every 6 (six) hours as needed  for mild pain.    [provider]  albuterol (VENTOLIN HFA) 108 (90 Base) MCG/ACT inhaler Inhale 2 puffs into the lungs every 6 (six) hours as needed for wheezing or shortness of breath.    [provider]  amoxicillin-clavulanate (AUGMENTIN) 875-125 MG tablet Take 1 tablet by mouth every 12 (twelve) hours. 04/01/19   Catarina Hartshorn, MD  baclofen (LIORESAL) 10 MG tablet Take 10 mg by mouth 3 (three) times daily as needed for muscle spasms.    [provider]  budesonide-formoterol (SYMBICORT) 160-4.5 MCG/ACT inhaler Inhale 2 puffs into the lungs 2 (two) times daily.    [provider]  calcium carbonate (TUMS - DOSED IN MG ELEMENTAL CALCIUM) 500 MG chewable tablet Chew 1 tablet by mouth 2 (two) times daily as needed for indigestion or heartburn.    [provider]  carvedilol (COREG) 12.5 MG tablet Take 1 tablet (12.5 mg total) by mouth every 12 (twelve) hours. 04/01/19   Catarina Hartshorn, MD  collagenase (SANTYL) ointment Apply 1 application topically daily.    [provider]  doxycycline (VIBRA-TABS) 100 MG tablet Take 1 tablet (100 mg total) by mouth every 12 (twelve) hours. 04/01/19   Catarina Hartshorn, MD  DULoxetine (CYMBALTA) 30 MG capsule Take 30 mg by mouth 2 (two) times daily.     [provider]  finasteride (PROSCAR) 5 MG tablet Take 5 mg by mouth daily.  [provider]  gabapentin (NEURONTIN) 300 MG capsule Take 600 mg by mouth 2 (two) times daily as needed (pain).    [provider]  hydrOXYzine (VISTARIL) 25 MG capsule Take 25 mg by mouth 3 (three) times daily as needed (sleep).    [provider]  insulin aspart (NOVOLOG) 100 UNIT/ML injection Inject 25 Units into the skin 3 (three) times daily before meals.     [provider]  insulin glargine (LANTUS) 100 UNIT/ML Solostar Pen Inject 25 Units into the skin in the morning, at noon, and at bedtime.  03/22/19   [provider]    ipratropium-albuterol (DUONEB) 0.5-2.5 (3) MG/3ML SOLN Take 3 mLs by nebulization every 4 (four) hours as needed (wheezing/SOB).    [provider]  isosorbide mononitrate (IMDUR) 30 MG 24 hr tablet Take 30 mg by mouth daily.     [provider]  metFORMIN (GLUCOPHAGE) 500 MG tablet Take 500 mg by mouth daily with breakfast.     [provider]  nicotine polacrilex (COMMIT) 2 MG lozenge Take 2 mg by mouth as needed for smoking cessation.    [provider]  tamsulosin (FLOMAX) 0.4 MG CAPS capsule Take 0.4 mg by mouth daily after supper.    [provider]  traZODone (DESYREL) 100 MG tablet Take 100 mg by mouth at bedtime as needed for sleep.     [provider]    Allergies    Patient has no known allergies.  Review of Systems   Review of Systems  Unable to perform ROS: Acuity of condition    Physical Exam Updated Vital Signs BP 137/85   Pulse 83   Temp (!) 95.7 F (35.4 C)   Resp 18   SpO2 100%   Physical Exam Vitals and nursing note reviewed.  Constitutional:      General: He is not in acute distress.    Appearance: He is well-developed. He is ill-appearing.     Comments: Opens his eyes to sternal rub, otherwise no response  HENT:     Head: Normocephalic.     Comments: Healing scab on the forehead    Right Ear: External ear normal.     Left Ear: External ear normal.  Eyes:     General: No scleral icterus.       Right eye: No discharge.        Left eye: No discharge.     Conjunctiva/sclera: Conjunctivae normal.  Neck:     Trachea: No tracheal deviation.  Cardiovascular:     Rate and Rhythm: Regular rhythm. Tachycardia present.  Pulmonary:     Effort: No respiratory distress.     Breath sounds: Normal breath sounds. No stridor. No wheezing or rales.     Comments: Labored breathing Abdominal:     General: Bowel sounds are normal. There is no distension.     Palpations: Abdomen is soft.     Tenderness: There is  no abdominal tenderness. There is no guarding or rebound.     Comments: Protuberant abdomen  Musculoskeletal:     Cervical back: Neck supple.     Right lower leg: No edema.     Left lower leg: No edema.     Comments: That is post amputation right lower extremity  Skin:    General: Skin is warm and dry.     Findings: No rash.  Neurological:     GCS: GCS eye subscore is 2. GCS verbal subscore is 1. GCS  motor subscore is 5.     Cranial Nerves: No cranial nerve deficit (No obvious facial droop, patient does not respond otherwise).     Motor: No abnormal muscle tone or seizure activity.     ED Results / Procedures / Treatments   Labs (all labs ordered are listed, but only abnormal results are displayed) Labs Reviewed  COMPREHENSIVE METABOLIC PANEL - Abnormal; Notable for the following components:      Result Value   Potassium 3.4 (*)    Glucose, Bld 25 (*)    Calcium 8.7 (*)    Albumin 3.1 (*)    All other components within normal limits  CBC WITH DIFFERENTIAL/PLATELET - Abnormal; Notable for the following components:   Abs Immature Granulocytes 0.09 (*)    All other components within normal limits  URINALYSIS, COMPLETE (UACMP) WITH MICROSCOPIC - Abnormal; Notable for the following components:   Protein, ur >=300 (*)    Bacteria, UA RARE (*)    All other components within normal limits  BLOOD GAS, ARTERIAL - Abnormal; Notable for the following components:   pH, Arterial 7.276 (*)    pCO2 arterial 64.3 (*)    pO2, Arterial 81.1 (*)    Acid-Base Excess 2.7 (*)    Allens test (pass/fail) LEFT RADIAL (*)    All other components within normal limits  SALICYLATE LEVEL - Abnormal; Notable for the following components:   Salicylate Lvl <7.0 (*)    All other components within normal limits  CBG MONITORING, ED - Abnormal; Notable for the following components:   Glucose-Capillary 22 (*)    All other components within normal limits  CBG MONITORING, ED - Abnormal; Notable for the  following components:   Glucose-Capillary 59 (*)    All other components within normal limits  CBG MONITORING, ED - Abnormal; Notable for the following components:   Glucose-Capillary 130 (*)    All other components within normal limits  CBG MONITORING, ED - Abnormal; Notable for the following components:   Glucose-Capillary 58 (*)    All other components within normal limits  CBG MONITORING, ED - Abnormal; Notable for the following components:   Glucose-Capillary 58 (*)    All other components within normal limits  CBG MONITORING, ED - Abnormal; Notable for the following components:   Glucose-Capillary 107 (*)    All other components within normal limits  CULTURE, BLOOD (ROUTINE X 2)  CULTURE, BLOOD (ROUTINE X 2)  URINE CULTURE  AMMONIA  LACTIC ACID, PLASMA  RAPID URINE DRUG SCREEN, HOSP PERFORMED  ETHANOL  ACETAMINOPHEN LEVEL  BLOOD GAS, ARTERIAL  POC SARS CORONAVIRUS 2 AG -  ED  CBG MONITORING, ED  CBG MONITORING, ED    EKG EKG Interpretation  Date/Time:  Saturday May 01 2019 15:32:19 EDT Ventricular Rate:  104 PR Interval:    QRS Duration: 89 QT Interval:  354 QTC Calculation: 466 R Axis:   82 Text Interpretation: Sinus tachycardia Atrial premature complex Borderline prolonged PR interval Borderline right axis deviation Low voltage, precordial leads Baseline wander in lead(s) V3 sinus rhythm has replaced a fib since last tracing Confirmed by Linwood DibblesKnapp, [place] [phone]([id number]) on 05/01/2019 3:41:51 PM   Radiology CT HEAD WO CONTRAST  Result Date: 05/01/2019 CLINICAL DATA:  Altered mental status, possible ingestion EXAM: CT HEAD WITHOUT CONTRAST TECHNIQUE: Contiguous axial images were obtained from the base of the skull through the vertex without intravenous contrast. COMPARISON:  03/27/2019 FINDINGS: Brain: No evidence of acute infarction, hemorrhage, hydrocephalus, extra-axial collection or mass lesion/mass  effect. Periventricular white matter hypodensity. Vascular: No hyperdense  vessel or unexpected calcification. Skull: Normal. Negative for fracture or focal lesion. Sinuses/Orbits: No acute finding. Other: None. IMPRESSION: No acute intracranial pathology.  Small-vessel white matter disease. Electronically Signed   By: Eddie Candle M.D.   On: 05/01/2019 18:05   DG Chest Port 1 View  Result Date: 05/01/2019 CLINICAL DATA:  Altered level of consciousness. EXAM: PORTABLE CHEST 1 VIEW COMPARISON:  03/28/2019 and prior radiographs FINDINGS: Cardiomegaly and pulmonary vascular congestion noted. Bibasilar atelectasis noted. There is no evidence of focal airspace disease, pulmonary edema, suspicious pulmonary nodule/mass, pleural effusion, or pneumothorax. No acute bony abnormalities are identified. IMPRESSION: Cardiomegaly with pulmonary vascular congestion and bibasilar atelectasis. Electronically Signed   By: Margarette Canada M.D.   On: 05/01/2019 18:07    Procedures .Critical Care Performed by: Dorie Rank, MD Authorized by: Dorie Rank, MD   Critical care provider statement:    Critical care time (minutes):  45   Critical care was time spent personally by me on the following activities:  Discussions with consultants, evaluation of patient's response to treatment, examination of patient, ordering and performing treatments and interventions, ordering and review of laboratory studies, ordering and review of radiographic studies, pulse oximetry, re-evaluation of patient's condition, obtaining history from patient or surrogate and review of old charts   (including critical care time)  Medications Ordered in ED Medications  dextrose 10 % infusion ( Intravenous Rate/Dose Change 05/01/19 1811)  dextrose 50 % solution (50 mLs Intravenous Given 05/01/19 1533)  thiamine (B-1) injection 100 mg (100 mg Intravenous Given 05/01/19 1558)  dextrose 50 % solution 12.5 g (12.5 g Intravenous Given 05/01/19 1559)  dextrose 50 % solution 12.5 g (12.5 g Intravenous Given 05/01/19 1700)  dextrose 10 %  infusion ( Intravenous Stopped 05/01/19 1815)  dextrose 50 % solution 25 g (25 g Intravenous Given 05/01/19 1815)    ED Course  I have reviewed the triage vital signs and the nursing notes.  Pertinent labs & imaging results that were available during my care of the patient were reviewed by me and considered in my medical decision making (see chart for details).  Clinical Course as of Apr 30 1904  Sat May 01, 2019  1549 Initial CBG in the ED was in the 20s.  Patient was given an amp of D50   [JK]  1553 Significant altered mental status.  Patient was profoundly hypoglycemic on arrival.  He has been given D50.  We will see if his mental status starts to improve.  Overdose is also a concern.  Head bleeds/stroke is also a concern.   [JK]  1631 ABG reviewed. Patient appears to have respiratory acidosis. I will order BiPAP.   [JK]  1712 Tolerating bipap.  VSS .  Mental status not significantly improved   [JK]  1713 Cbg dropped to 50s again.   D50 was given. Will start d10 infusion.     [JK]  [place]  CBC normal.  Metabolic panel notable for the hypoglycemia but that was drawn prior to the initial D50.   [JK]  1714 Urinalysis without signs of infection.  UDS is negative.  Lactic acid level is also negative arguing against sepsis   [JK]  1855 Chest x-ray reviewed   [JK]  1904 Patient has had recurrent hypoglycemia requiring repetitive dextrose IV infusions.  I have also have him on a maintenance dextrose infusion.   [JK]  1904 Patient's mental status has improved intermittently when  he is not hypoglycemic.  Not entirely clear at this time what is causing his recurrent hypoglycemia.   [JK]  1905 Vital signs have been stable while he has been on BiPAP.  I will order repeat ABG to make sure his acidosis is improving.  I will consult the medical service for admission.   [JK]    Clinical Course User Index [JK] Linwood Dibbles, MD   MDM Rules/Calculators/A&P                      Patient  presented to ED for altered mental status.  Patient's ED work-up is notable for recurrent hypoglycemia.  No signs of acute infection.  No findings to suggest salicylate or acetaminophen overdose.  Patient also had a respiratory acidosis.  I think this was likely related to his hyponatremia.  Patient's body habitus also is suggestive of a component of obstructive sleep apnea.  No signs of acute findings on his CT scan.  Patient had brief improvement of his mental status with euglycemia however his blood sugar would drop again in his mental status with diminished again.  Unclear what has triggered this prolonged hypoglycemia.  Patient will require further monitoring.  We will continue him on BiPAP.  I will consult with the medical service for admission.  Patient will need intensive care unit monitoring. Final Clinical Impression(s) / ED Diagnoses Final diagnoses:  Hypoglycemia  Respiratory acidosis  Hypothermia, initial encounter     Linwood Dibbles, MD 05/01/19 [place] Moment

## 2019-05-01 NOTE — H&P (Addendum)
History and Physical  Mackay Hanauer ZJI:[id number] DOB: 10/05/1955 DOA: 05/01/2019  Referring physician: Dr Lynelle Doctor, ED physician PCP: Patient, No Pcp Per  Outpatient Specialists:  Patient Coming From: [facility] [address] Chief Complaint: AMS  HPI: Darren West is a 64 y.o. male with a history of diabetes, atrial fibrillation, schizophrenia, hypertension, right BKA.  Patient was found down at a hotel.  Patient was unresponsive and continues to be unresponsive.  Upon arrival, his blood sugar was noted to be in the 50s.  The patient was given an amp of D50 which momentarily improve the patient's blood sugars, but his blood sugars return to the 50s.  More D50 was given, but with similar results: None brief increase in blood sugars, then returned to 50s.  Patient was placed on BiPAP to maintain patient's oxygenation.  Patient becomes alert with stimulation, but has not had meaningful conversation.  Review of Systems:  Unable to obtain  Past Medical History:  Diagnosis Date  . Below-knee amputation of right lower extremity (HCC)   . Diabetes mellitus without complication (HCC)   . Frequent urination   . Hypertension   . Neuropathy   . Schizophrenia [facility])    Past Surgical History:  Procedure Laterality Date  . BELOW KNEE LEG AMPUTATION     Social History:  reports that he has never smoked. He has never used smokeless tobacco. He reports that he does not drink alcohol or use drugs.   No Known Allergies  No family history on file.  Unable to obtain  Prior to Admission medications   Medication Sig Start Date End Date Taking? Authorizing Provider  acetaminophen (TYLENOL) 500 MG tablet Take 500 mg by mouth every 6 (six) hours as needed for mild pain.    [provider]  albuterol (VENTOLIN HFA) 108 (90 Base) MCG/ACT inhaler Inhale 2 puffs into the lungs every 6 (six) hours as needed for wheezing or shortness of breath.    [provider]  amoxicillin-clavulanate (AUGMENTIN) 875-125 MG  tablet Take 1 tablet by mouth every 12 (twelve) hours. 04/01/19   Catarina Hartshorn, MD  baclofen (LIORESAL) 10 MG tablet Take 10 mg by mouth 3 (three) times daily as needed for muscle spasms.    [provider]  budesonide-formoterol (SYMBICORT) 160-4.5 MCG/ACT inhaler Inhale 2 puffs into the lungs 2 (two) times daily.    [provider]  calcium carbonate (TUMS - DOSED IN MG ELEMENTAL CALCIUM) 500 MG chewable tablet Chew 1 tablet by mouth 2 (two) times daily as needed for indigestion or heartburn.    [provider]  carvedilol (COREG) 12.5 MG tablet Take 1 tablet (12.5 mg total) by mouth every 12 (twelve) hours. 04/01/19   Catarina Hartshorn, MD  collagenase (SANTYL) ointment Apply 1 application topically daily.    [provider]  doxycycline (VIBRA-TABS) 100 MG tablet Take 1 tablet (100 mg total) by mouth every 12 (twelve) hours. 04/01/19   Catarina Hartshorn, MD  DULoxetine (CYMBALTA) 30 MG capsule Take 30 mg by mouth 2 (two) times daily.     [provider]  finasteride (PROSCAR) 5 MG tablet Take 5 mg by mouth daily.    [provider]  gabapentin (NEURONTIN) 300 MG capsule Take 600 mg by mouth 2 (two) times daily as needed (pain).    [provider]  hydrOXYzine (VISTARIL) 25 MG capsule Take 25 mg by mouth 3 (three) times daily as needed (sleep).    [provider]  insulin aspart (NOVOLOG) 100 UNIT/ML injection Inject 25  Units into the skin 3 (three) times daily before meals.     [provider]  insulin glargine (LANTUS) 100 UNIT/ML Solostar Pen Inject 25 Units into the skin in the morning, at noon, and at bedtime.  03/22/19   [provider]  ipratropium-albuterol (DUONEB) 0.5-2.5 (3) MG/3ML SOLN Take 3 mLs by nebulization every 4 (four) hours as needed (wheezing/SOB).    [provider]  isosorbide mononitrate (IMDUR) 30 MG 24 hr tablet Take 30 mg by mouth daily.     [provider]  metFORMIN (GLUCOPHAGE) 500  MG tablet Take 500 mg by mouth daily with breakfast.     [provider]  nicotine polacrilex (COMMIT) 2 MG lozenge Take 2 mg by mouth as needed for smoking cessation.    [provider]  tamsulosin (FLOMAX) 0.4 MG CAPS capsule Take 0.4 mg by mouth daily after supper.    [provider]  traZODone (DESYREL) 100 MG tablet Take 100 mg by mouth at bedtime as needed for sleep.     [provider]    Physical Exam: BP 139/82   Pulse 83   Temp (!) 95.5 F (35.3 C)   Resp 17   SpO2 100%   . General: Elderly male.  Briefly awakens with stimulation, but becomes somnolent again. No acute cardiopulmonary distress.  Marland Kitchen HEENT: Normocephalic atraumatic.  Right and left ears normal in appearance.  Pupils equal, round, reactive to light. Extraocular muscles are intact. Sclerae anicteric and noninjected.  Moist mucosal membranes. No mucosal lesions.  . Neck: Neck supple without lymphadenopathy. No carotid bruits. No masses palpated.  . Cardiovascular: Regular rate with normal S1-S2 sounds. No murmurs, rubs, gallops auscultated. No JVD.  Marland Kitchen Respiratory: Moderate respiratory effort.  No wheezes, rales auscultated.  No accessory muscle use. . Abdomen: Obese.  Soft, nontender, nondistended. Active bowel sounds. No masses or hepatosplenomegaly  . Skin: No rashes, lesions, or ulcerations.  Dry, warm to touch. 2+ dorsalis pedis and radial pulses. . Musculoskeletal: Right BKA.  Stump appears without lesions or ulcerations.  Left foot warm with good perfusion.  No calf or leg pain. All major joints not erythematous nontender.  No upper or lower joint deformation.  Good ROM.  No contractures  . Psychiatric: Intact judgment and insight. Pleasant and cooperative. . Neurologic: No focal neurological deficits. Strength is 5/5 and symmetric in upper and lower extremities.  Cranial nerves II through XII are grossly intact.           Labs on Admission: I have personally reviewed following  labs and imaging studies  CBC: Recent Labs  Lab 05/01/19 1534  WBC 7.7  NEUTROABS 5.2  HGB 13.6  HCT 43.5  MCV 92.8  PLT 299   Basic Metabolic Panel: Recent Labs  Lab 05/01/19 1534  NA 142  K 3.4*  CL 108  CO2 27  GLUCOSE 25*  BUN 22  CREATININE 0.80  CALCIUM 8.7*   GFR: CrCl cannot be calculated (Unknown ideal weight.). Liver Function Tests: Recent Labs  Lab 05/01/19 1534  AST 27  ALT 20  ALKPHOS 120  BILITOT 0.4  PROT 7.6  ALBUMIN 3.1*   No results for input(s): LIPASE, AMYLASE in the last 168 hours. Recent Labs  Lab 05/01/19 1620  AMMONIA 24   Coagulation Profile: No results for input(s): INR, PROTIME in the last 168 hours. Cardiac Enzymes: No results for input(s): CKTOTAL, CKMB, CKMBINDEX, TROPONINI in the last 168 hours. BNP (last 3 results) No results for  input(s): PROBNP in the last 8760 hours. HbA1C: No results for input(s): HGBA1C in the last 72 hours. CBG: Recent Labs  Lab 05/01/19 1714 05/01/19 1805 05/01/19 1833 05/01/19 1905 05/01/19 1935  GLUCAP 88 58* 107* 86 106*   Lipid Profile: No results for input(s): CHOL, HDL, LDLCALC, TRIG, CHOLHDL, LDLDIRECT in the last 72 hours. Thyroid Function Tests: No results for input(s): TSH, T4TOTAL, FREET4, T3FREE, THYROIDAB in the last 72 hours. Anemia Panel: No results for input(s): VITAMINB12, FOLATE, FERRITIN, TIBC, IRON, RETICCTPCT in the last 72 hours. Urine analysis:    Component Value Date/Time   COLORURINE YELLOW 05/01/2019 1548   APPEARANCEUR CLEAR 05/01/2019 1548   LABSPEC 1.025 05/01/2019 1548   PHURINE 5.0 05/01/2019 1548   GLUCOSEU NEGATIVE 05/01/2019 1548   HGBUR NEGATIVE 05/01/2019 1548   BILIRUBINUR NEGATIVE 05/01/2019 1548   KETONESUR NEGATIVE 05/01/2019 1548   PROTEINUR >=300 (A) 05/01/2019 1548   NITRITE NEGATIVE 05/01/2019 1548   LEUKOCYTESUR NEGATIVE 05/01/2019 1548   Sepsis Labs: @LABRCNTIP (procalcitonin:4,lacticidven:4) ) Recent Results (from the past 240  hour(s))  Blood Cultures (routine x 2)     Status: None (Preliminary result)   Collection Time: 05/01/19  3:34 PM   Specimen: BLOOD LEFT HAND  Result Value Ref Range Status   Specimen Description   Final    BLOOD LEFT HAND BOTTLES DRAWN AEROBIC AND ANAEROBIC   Special Requests   Final    Blood Culture adequate volume Performed at [facility], [address], [place], [place]    Culture PENDING  Incomplete   Report Status PENDING  Incomplete  Blood Cultures (routine x 2)     Status: None (Preliminary result)   Collection Time: 05/01/19  4:06 PM   Specimen: BLOOD RIGHT HAND  Result Value Ref Range Status   Specimen Description BLOOD RIGHT HAND BOTTLES DRAWN AEROBIC ONLY  Final   Special Requests   Final    Blood Culture results may not be optimal due to an inadequate volume of blood received in culture bottles Performed at [facility], [address], [place], [place]    Culture PENDING  Incomplete   Report Status PENDING  Incomplete     Radiological Exams on Admission: CT HEAD WO CONTRAST  Result Date: 05/01/2019 CLINICAL DATA:  Altered mental status, possible ingestion EXAM: CT HEAD WITHOUT CONTRAST TECHNIQUE: Contiguous axial images were obtained from the base of the skull through the vertex without intravenous contrast. COMPARISON:  03/27/2019 FINDINGS: Brain: No evidence of acute infarction, hemorrhage, hydrocephalus, extra-axial collection or mass lesion/mass effect. Periventricular white matter hypodensity. Vascular: No hyperdense vessel or unexpected calcification. Skull: Normal. Negative for fracture or focal lesion. Sinuses/Orbits: No acute finding. Other: None. IMPRESSION: No acute intracranial pathology.  Small-vessel white matter disease. Electronically Signed   By: 03/29/2019 M.D.   On: 05/01/2019 18:05   DG Chest Port 1 View  Result Date: 05/01/2019 CLINICAL DATA:  Altered level of consciousness. EXAM: PORTABLE CHEST 1 VIEW COMPARISON:  03/28/2019  and prior radiographs FINDINGS: Cardiomegaly and pulmonary vascular congestion noted. Bibasilar atelectasis noted. There is no evidence of focal airspace disease, pulmonary edema, suspicious pulmonary nodule/mass, pleural effusion, or pneumothorax. No acute bony abnormalities are identified. IMPRESSION: Cardiomegaly with pulmonary vascular congestion and bibasilar atelectasis. Electronically Signed   By: 03/30/2019 M.D.   On: 05/01/2019 18:07    EKG: Independently reviewed.  Sinus tachycardia with PACs.  Slightly prolonged PR interval.  Right axis deviation.  No acute ST changes.  Assessment/Plan:  Principal Problem:   Hypoglycemia Active Problems:   Uncontrolled diabetes mellitus (HCC)   Essential hypertension   S/P BKA (below knee amputation) unilateral, right (HCC)   Atrial fibrillation, chronic ([place])   Acute metabolic encephalopathy   Hypokalemia    This patient was discussed with the ED physician, including pertinent vitals, physical exam findings, labs, and imaging.  We also discussed care given by the ED provider.  1. Hypoglycemia with uncontrolled diabetes on insulin with neuropathy and status post BKA a. Admit to stepdown b. ? Overdose on insulin c. Does not appear infectious. d. Patient is on D10 drip e. Monitor blood sugars CBGs every 1 hour for the next 2 to 3 hours, then every 2 hours  f. sliding scale insulin 2. Hypothermia a. Possibly secondary to hypoglycemia b. Does not appear to be infectious etiology: WBC normal, lactic acid normal.  c. Bear-hugger and other warming therapies 3. Acute metabolic encephalopathy a. Likely secondary to hypoglycemia.  There is a question of whether the patient the patient overdosed on his medications as the patient is on occasions that could cause somnolence b. Patient on BiPAP c. Recheck ABG d. Hold somnolent medications 4. Hypokalemia a. Replace potassium b. Check magnesium c. Recheck potassium tomorrow 5. Atrial  fibrillation a. Currently in sinus rhythm b. Continue Coreg c. It does not appear that he is on blood thinners d. Start Lovenox at prophylactic dose 6. Hypertension a. Continue antihypertensives  DVT prophylaxis: Prophylactic Lovenox Consultants: None Code Status: Presumed full code Family Communication: None Disposition Plan: Pending   Truett Mainland, DO

## 2019-05-01 NOTE — ED Notes (Signed)
CRITICAL VALUE ALERT  Critical Value:  Glucose 25   Date & Time Notied:  05/01/19 1440hrs  Provider Notified: EDP  Orders Received/Actions taken: notified

## 2019-05-01 NOTE — ED Triage Notes (Signed)
Pt found at [facility] unconscious. Pt unable to arose, 88% on room air. EMS states pill bottles in the floor. CBG 139 with EMS  Pt unable to arose with sternal rub.

## 2019-05-02 DIAGNOSIS — E162 Hypoglycemia, unspecified: Secondary | ICD-10-CM | POA: Diagnosis not present

## 2019-05-02 LAB — GLUCOSE, CAPILLARY
Glucose-Capillary: 105 mg/dL — ABNORMAL HIGH (ref 70–99)
Glucose-Capillary: 118 mg/dL — ABNORMAL HIGH (ref 70–99)
Glucose-Capillary: 130 mg/dL — ABNORMAL HIGH (ref 70–99)
Glucose-Capillary: 49 mg/dL — ABNORMAL LOW (ref 70–99)
Glucose-Capillary: 61 mg/dL — ABNORMAL LOW (ref 70–99)
Glucose-Capillary: 63 mg/dL — ABNORMAL LOW (ref 70–99)
Glucose-Capillary: 63 mg/dL — ABNORMAL LOW (ref 70–99)
Glucose-Capillary: 70 mg/dL (ref 70–99)
Glucose-Capillary: 73 mg/dL (ref 70–99)
Glucose-Capillary: 82 mg/dL (ref 70–99)
Glucose-Capillary: 91 mg/dL (ref 70–99)
Glucose-Capillary: 94 mg/dL (ref 70–99)
Glucose-Capillary: 96 mg/dL (ref 70–99)
Glucose-Capillary: 98 mg/dL (ref 70–99)

## 2019-05-02 LAB — BLOOD GAS, ARTERIAL
Acid-Base Excess: 4.4 mmol/L — ABNORMAL HIGH (ref 0.0–2.0)
Bicarbonate: 27.5 mmol/L (ref 20.0–28.0)
FIO2: 32
O2 Saturation: 96.5 %
Patient temperature: 36.3
pCO2 arterial: 55.2 mmHg — ABNORMAL HIGH (ref 32.0–48.0)
pH, Arterial: 7.349 — ABNORMAL LOW (ref 7.350–7.450)
pO2, Arterial: 86.5 mmHg (ref 83.0–108.0)

## 2019-05-02 LAB — CBC WITH DIFFERENTIAL/PLATELET
Abs Immature Granulocytes: 0.05 10*3/uL (ref 0.00–0.07)
Basophils Absolute: 0 10*3/uL (ref 0.0–0.1)
Basophils Relative: 0 %
Eosinophils Absolute: 0.1 10*3/uL (ref 0.0–0.5)
Eosinophils Relative: 1 %
HCT: 36.9 % — ABNORMAL LOW (ref 39.0–52.0)
Hemoglobin: 11.5 g/dL — ABNORMAL LOW (ref 13.0–17.0)
Immature Granulocytes: 1 %
Lymphocytes Relative: 16 %
Lymphs Abs: 1.4 10*3/uL (ref 0.7–4.0)
MCH: 29.1 pg (ref 26.0–34.0)
MCHC: 31.2 g/dL (ref 30.0–36.0)
MCV: 93.4 fL (ref 80.0–100.0)
Monocytes Absolute: 0.9 10*3/uL (ref 0.1–1.0)
Monocytes Relative: 10 %
Neutro Abs: 6.6 10*3/uL (ref 1.7–7.7)
Neutrophils Relative %: 72 %
Platelets: 252 10*3/uL (ref 150–400)
RBC: 3.95 MIL/uL — ABNORMAL LOW (ref 4.22–5.81)
RDW: 13.3 % (ref 11.5–15.5)
WBC: 9.1 10*3/uL (ref 4.0–10.5)
nRBC: 0 % (ref 0.0–0.2)

## 2019-05-02 LAB — BASIC METABOLIC PANEL
Anion gap: 7 (ref 5–15)
BUN: 20 mg/dL (ref 8–23)
CO2: 27 mmol/L (ref 22–32)
Calcium: 8.3 mg/dL — ABNORMAL LOW (ref 8.9–10.3)
Chloride: 106 mmol/L (ref 98–111)
Creatinine, Ser: 0.81 mg/dL (ref 0.61–1.24)
GFR calc Af Amer: >60 mL/min (ref >60)
GFR calc non Af Amer: >60 mL/min (ref >60)
Glucose, Bld: 72 mg/dL (ref 70–99)
Potassium: 4 mmol/L (ref 3.5–5.1)
Sodium: 140 mmol/L (ref 135–145)

## 2019-05-02 LAB — MRSA PCR SCREENING: MRSA by PCR: NEGATIVE

## 2019-05-02 MED ORDER — DEXTROSE 50 % IV SOLN: 1.0000 | Freq: Once | INTRAVENOUS | Status: AC

## 2019-05-02 MED ORDER — GABAPENTIN 300 MG PO CAPS
600.0000 mg | ORAL_CAPSULE | Freq: Two times a day (BID) | ORAL | Status: DC | PRN
  Administered 2019-05-02 – 2019-05-04 (×3): 600 mg via ORAL
  Filled 2019-05-02 (×3): qty 2

## 2019-05-02 MED ORDER — DEXTROSE 50 % IV SOLN
INTRAVENOUS | Status: AC
  Administered 2019-05-02: 50 mL
  Filled 2019-05-02: qty 50

## 2019-05-02 MED ORDER — CHLORHEXIDINE GLUCONATE CLOTH 2 % EX PADS
6.0000 | MEDICATED_PAD | Freq: Every day | CUTANEOUS | Status: DC
  Administered 2019-05-02 – 2019-05-03 (×2): 6 via TOPICAL

## 2019-05-02 MED ORDER — DULOXETINE HCL 30 MG PO CPEP
30.0000 mg | ORAL_CAPSULE | Freq: Two times a day (BID) | ORAL | Status: DC
  Administered 2019-05-02 – 2019-05-04 (×4): 30 mg via ORAL
  Filled 2019-05-02 (×4): qty 1

## 2019-05-02 MED ORDER — BACLOFEN 10 MG PO TABS
10.0000 mg | ORAL_TABLET | Freq: Four times a day (QID) | ORAL | Status: DC
  Administered 2019-05-02 – 2019-05-04 (×7): 10 mg via ORAL
  Filled 2019-05-02 (×7): qty 1

## 2019-05-02 MED ORDER — LORAZEPAM 2 MG/ML IJ SOLN
1.0000 mg | Freq: Once | INTRAMUSCULAR | Status: AC
  Administered 2019-05-02: 1 mg via INTRAVENOUS
  Filled 2019-05-02: qty 1

## 2019-05-02 MED ORDER — NYSTATIN 100000 UNIT/GM EX POWD
Freq: Two times a day (BID) | CUTANEOUS | Status: DC
  Filled 2019-05-02 (×2): qty 15

## 2019-05-02 MED ORDER — TRAZODONE HCL 50 MG PO TABS
100.0000 mg | ORAL_TABLET | Freq: Every evening | ORAL | Status: DC | PRN
  Administered 2019-05-02 – 2019-05-04 (×2): 100 mg via ORAL
  Filled 2019-05-02 (×2): qty 2

## 2019-05-02 MED ORDER — ACETAMINOPHEN 325 MG PO TABS
650.0000 mg | ORAL_TABLET | Freq: Four times a day (QID) | ORAL | Status: DC | PRN
  Administered 2019-05-02 – 2019-05-04 (×2): 650 mg via ORAL
  Filled 2019-05-02 (×2): qty 2

## 2019-05-02 NOTE — Progress Notes (Signed)
PROGRESS NOTE    Darren West  IOX:[id number] DOB: 1955-06-09 DOA: 05/01/2019 PCP: Patient, No Pcp Per   Brief Narrative:  Per HPI: Darren West is a 64 y.o. male with a history of diabetes, atrial fibrillation, schizophrenia, hypertension, right BKA.  Patient was found down at a hotel.  Patient was unresponsive and continues to be unresponsive.  Upon arrival, his blood sugar was noted to be in the 50s.  The patient was given an amp of D50 which momentarily improve the patient's blood sugars, but his blood sugars return to the 50s.  More D50 was given, but with similar results: None brief increase in blood sugars, then returned to 50s.  Patient was placed on BiPAP to maintain patient's oxygenation.  Patient becomes alert with stimulation, but has not had meaningful conversation.  4/25: Patient was admitted with acute metabolic encephalopathy related to persistent hypoglycemia requiring D10 IV infusion.  He was also noted to be hypothermic.  It appears that he might have overdosed on his home insulin and whether this was intentional or not remains to be seen.  C-peptide has been ordered.  Patient appears to be slightly more alert and diet will be initiated.  Plan to discontinue D10 IV fluid as blood glucose begins to elevate.  Assessment & Plan:   Principal Problem:   Hypoglycemia Active Problems:   Uncontrolled diabetes mellitus (HCC)   Essential hypertension   S/P BKA (below knee amputation) unilateral, right (HCC)   Atrial fibrillation, chronic (HCC)   Acute metabolic encephalopathy   Hypokalemia   Acute metabolic encephalopathy secondary to severe-persistent hypoglycemia -Query insulin overdose intentional versus nonintentional -Patient appears to be showing some signs of improvement and therefore diet has been ordered -Continue D10 infusion for now and plan to slow her rate based on elevating blood glucose levels -Continue to monitor CBGs closely -C-peptide ordered and  pending -Ongoing critical care evaluation required  Hypothermia secondary to above -Continue to monitor, appears to be resolving  Hypokalemia-resolved -Recheck in a.m.  Atrial fibrillation-currently NSR -Continue on Coreg and monitor on telemetry -Uncertain why he is not on blood thinners, but he appears to be high risk for any blood thinners given what appears to be medication noncompliance and high fall risk  Hypertension-stable -Continue on Coreg and Imdur  DVT prophylaxis:Lovenox Code Status: Full Family Communication: None at bedside; no contacts listed Disposition Plan: Continue on D10 as currently prescribed until blood glucose levels stabilize.  Anticipate starting diet soon once confusion improves as well.  Additionally, will need TTS evaluation prior to discharge.   Consultants:   None  Procedures:   None  Antimicrobials:   None   Subjective: Patient seen and evaluated today with intermittent somnolence noted.  He was noted to be quite agitated and disruptive to the nursing staff overnight.  He remains on D10 IV infusion with blood glucose that still demonstrates ongoing hypoglycemia.  Objective: Vitals:   05/02/19 0900 05/02/19 1000 05/02/19 1107 05/02/19 1132  BP:  137/89    Pulse: 96 98  93  Resp: 20 19  (!) 21  Temp: 99.1 F (37.3 C) 99.1 F (37.3 C)  98.2 F (36.8 C)  TempSrc:    Oral  SpO2: 93% 98% 95% 94%  Weight:      Height:        Intake/Output Summary (Last 24 hours) at 05/02/2019 1435 Last data filed at 05/02/2019 1000 Gross per 24 hour  Intake 2875.51 ml  Output 1950 ml  Net 925.51 ml  Filed Weights   05/01/19 2319  Weight: (!) 137.1 kg    Examination:  General exam: Appears calm and comfortable  Respiratory system: Clear to auscultation. Respiratory effort normal.  Currently on nasal cannula oxygen. Cardiovascular system: S1 & S2 heard, RRR. No JVD, murmurs, rubs, gallops or clicks. No pedal edema. Gastrointestinal system:  Abdomen is nondistended, soft and nontender. No organomegaly or masses felt. Normal bowel sounds heard. Central nervous system: Somnolent, but arousable Extremities: Symmetric 5 x 5 power.  Right BKA. Skin: No rashes, lesions or ulcers Psychiatry: Cannot be assessed given current condition    Data Reviewed: I have personally reviewed following labs and imaging studies  CBC: Recent Labs  Lab 05/01/19 1534 05/02/19 1310  WBC 7.7 9.1  NEUTROABS 5.2 6.6  HGB 13.6 11.5*  HCT 43.5 36.9*  MCV 92.8 93.4  PLT 299 035   Basic Metabolic Panel: Recent Labs  Lab 05/01/19 1534 05/02/19 0427  NA 142 140  K 3.4* 4.0  CL 108 106  CO2 27 27  GLUCOSE 25* 72  BUN 22 20  CREATININE 0.80 0.81  CALCIUM 8.7* 8.3*  MG 2.2  --    GFR: Estimated Creatinine Clearance: 134 mL/min (by C-G formula based on SCr of 0.81 mg/dL). Liver Function Tests: Recent Labs  Lab 05/01/19 1534  AST 27  ALT 20  ALKPHOS 120  BILITOT 0.4  PROT 7.6  ALBUMIN 3.1*   No results for input(s): LIPASE, AMYLASE in the last 168 hours. Recent Labs  Lab 05/01/19 1620  AMMONIA 24   Coagulation Profile: No results for input(s): INR, PROTIME in the last 168 hours. Cardiac Enzymes: No results for input(s): CKTOTAL, CKMB, CKMBINDEX, TROPONINI in the last 168 hours. BNP (last 3 results) No results for input(s): PROBNP in the last 8760 hours. HbA1C: No results for input(s): HGBA1C in the last 72 hours. CBG: Recent Labs  Lab 05/02/19 0646 05/02/19 0718 05/02/19 0927 05/02/19 1130 05/02/19 1237  GLUCAP 94 82 63* 49* 98   Lipid Profile: No results for input(s): CHOL, HDL, LDLCALC, TRIG, CHOLHDL, LDLDIRECT in the last 72 hours. Thyroid Function Tests: No results for input(s): TSH, T4TOTAL, FREET4, T3FREE, THYROIDAB in the last 72 hours. Anemia Panel: No results for input(s): VITAMINB12, FOLATE, FERRITIN, TIBC, IRON, RETICCTPCT in the last 72 hours. Sepsis Labs: Recent Labs  Lab 05/01/19 1534   LATICACIDVEN 0.8    Recent Results (from the past 240 hour(s))  Blood Cultures (routine x 2)     Status: None (Preliminary result)   Collection Time: 05/01/19  3:34 PM   Specimen: BLOOD LEFT HAND  Result Value Ref Range Status   Specimen Description   Final    BLOOD LEFT HAND BOTTLES DRAWN AEROBIC AND ANAEROBIC   Special Requests Blood Culture adequate volume  Final   Culture   Final    NO GROWTH < 24 HOURS Performed at [facility], [address] [place], [place] [address]    Report Status PENDING  Incomplete  Blood Cultures (routine x 2)     Status: None (Preliminary result)   Collection Time: 05/01/19  4:06 PM   Specimen: BLOOD RIGHT HAND  Result Value Ref Range Status   Specimen Description BLOOD RIGHT HAND BOTTLES DRAWN AEROBIC ONLY  Final   Special Requests   Final    Blood Culture results may not be optimal due to an inadequate volume of blood received in culture bottles   Culture   Final    NO GROWTH < 24  HOURS Performed at [facility] [address], [place], [place] [address]    Report Status PENDING  Incomplete  Respiratory Panel by RT PCR (Flu A&B, Covid) - Nasopharyngeal Swab     Status: None   Collection Time: 05/01/19  7:18 PM   Specimen: Nasopharyngeal Swab  Result Value Ref Range Status   SARS Coronavirus 2 by RT PCR NEGATIVE NEGATIVE Final    Comment: (NOTE) SARS-CoV-2 target nucleic acids are NOT DETECTED. The SARS-CoV-2 RNA is generally detectable in upper respiratoy specimens during the acute phase of infection. The lowest concentration of SARS-CoV-2 viral copies this assay can detect is 131 copies/mL. A negative result does not preclude SARS-Cov-2 infection and should not be used as the sole basis for treatment or other patient management decisions. A negative result may occur with  improper specimen collection/handling, submission of specimen other than nasopharyngeal swab, presence of viral mutation(s) within the areas targeted by this assay, and  inadequate number of viral copies (<131 copies/mL). A negative result must be combined with clinical observations, patient history, and epidemiological information. The expected result is Negative. Fact Sheet for Patients:  https://www.moore.com/ Fact Sheet for Healthcare Providers:  https://www.young.biz/ This test is not yet ap proved or cleared by the [place] FDA and  has been authorized for detection and/or diagnosis of SARS-CoV-2 by FDA under an Emergency Use Authorization (EUA). This EUA will remain  in effect (meaning this test can be used) for the duration of the COVID-19 declaration under Section 564(b)(1) of the Act, 21 U.S.C. section 360bbb-3(b)(1), unless the authorization is terminated or revoked sooner.    Influenza A by PCR NEGATIVE NEGATIVE Final   Influenza B by PCR NEGATIVE NEGATIVE Final    Comment: (NOTE) The Xpert Xpress SARS-CoV-2/FLU/RSV assay is intended as an aid in  the diagnosis of influenza from Nasopharyngeal swab specimens and  should not be used as a sole basis for treatment. Nasal washings and  aspirates are unacceptable for Xpert Xpress SARS-CoV-2/FLU/RSV  testing. Fact Sheet for Patients: https://www.moore.com/ Fact Sheet for Healthcare Providers: https://www.young.biz/ This test is not yet approved or cleared by the [place] FDA and  has been authorized for detection and/or diagnosis of SARS-CoV-2 by  FDA under an Emergency Use Authorization (EUA). This EUA will remain  in effect (meaning this test can be used) for the duration of the  Covid-19 declaration under Section 564(b)(1) of the Act, 21  U.S.C. section 360bbb-3(b)(1), unless the authorization is  terminated or revoked. Performed at [facility], [address] [place], [place] [address]   MRSA PCR Screening     Status: None   Collection Time: 05/01/19 10:40 PM   Specimen: Nasal Mucosa; Nasopharyngeal   Result Value Ref Range Status   MRSA by PCR NEGATIVE NEGATIVE Final    Comment:        The GeneXpert MRSA Assay (FDA approved for NASAL specimens only), is one component of a comprehensive MRSA colonization surveillance program. It is not intended to diagnose MRSA infection nor to guide or monitor treatment for MRSA infections. Performed at [facility], [address] [place], [place] [address]          Radiology Studies: CT HEAD WO CONTRAST  Result Date: 05/01/2019 CLINICAL DATA:  Altered mental status, possible ingestion EXAM: CT HEAD WITHOUT CONTRAST TECHNIQUE: Contiguous axial images were obtained from the base of the skull through the vertex without intravenous contrast. COMPARISON:  03/27/2019 FINDINGS: Brain: No evidence of acute infarction, hemorrhage, hydrocephalus, extra-axial collection  or mass lesion/mass effect. Periventricular white matter hypodensity. Vascular: No hyperdense vessel or unexpected calcification. Skull: Normal. Negative for fracture or focal lesion. Sinuses/Orbits: No acute finding. Other: None. IMPRESSION: No acute intracranial pathology.  Small-vessel white matter disease. Electronically Signed   By: Lauralyn Primes M.D.   On: 05/01/2019 18:05   DG Chest Port 1 View  Result Date: 05/01/2019 CLINICAL DATA:  Altered level of consciousness. EXAM: PORTABLE CHEST 1 VIEW COMPARISON:  03/28/2019 and prior radiographs FINDINGS: Cardiomegaly and pulmonary vascular congestion noted. Bibasilar atelectasis noted. There is no evidence of focal airspace disease, pulmonary edema, suspicious pulmonary nodule/mass, pleural effusion, or pneumothorax. No acute bony abnormalities are identified. IMPRESSION: Cardiomegaly with pulmonary vascular congestion and bibasilar atelectasis. Electronically Signed   By: Harmon Pier M.D.   On: 05/01/2019 18:07        Scheduled Meds: . carvedilol  12.5 mg Oral Q12H  . Chlorhexidine Gluconate Cloth  6 each Topical Daily  . enoxaparin  (LOVENOX) injection  40 mg Subcutaneous QHS  . fluticasone furoate-vilanterol  1 puff Inhalation Daily  . insulin aspart  0-15 Units Subcutaneous Q4H  . isosorbide mononitrate  30 mg Oral Daily   Continuous Infusions: . dextrose 150 mL/hr at 05/02/19 0800     LOS: 1 day    Time spent: 40 minutes    Keajah Killough Hoover Brunette, DO [facility]  If 7PM-7AM, please contact night-coverage [email or web address] 05/02/2019, 2:35 PM

## 2019-05-02 NOTE — Progress Notes (Signed)
Received verbal order to perform in and out cath  From Dr. Sherryll Burger. Returned 625 mls. Of clear yellow urine. This was the first in and out cath post foley removal this morning.

## 2019-05-02 NOTE — Progress Notes (Signed)
Breo inhaler is in room. Patient is awake now and most likely will not need BiPAP much longer.
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
## 2019-05-03 DIAGNOSIS — E162 Hypoglycemia, unspecified: Secondary | ICD-10-CM | POA: Diagnosis not present

## 2019-05-03 LAB — BASIC METABOLIC PANEL
Anion gap: 7 (ref 5–15)
BUN: 14 mg/dL (ref 8–23)
CO2: 28 mmol/L (ref 22–32)
Calcium: 8.5 mg/dL — ABNORMAL LOW (ref 8.9–10.3)
Chloride: 103 mmol/L (ref 98–111)
Creatinine, Ser: 0.97 mg/dL (ref 0.61–1.24)
GFR calc Af Amer: >60 mL/min (ref >60)
GFR calc non Af Amer: >60 mL/min (ref >60)
Glucose, Bld: 102 mg/dL — ABNORMAL HIGH (ref 70–99)
Potassium: 3.9 mmol/L (ref 3.5–5.1)
Sodium: 138 mmol/L (ref 135–145)

## 2019-05-03 LAB — GLUCOSE, CAPILLARY
Glucose-Capillary: 102 mg/dL — ABNORMAL HIGH (ref 70–99)
Glucose-Capillary: 108 mg/dL — ABNORMAL HIGH (ref 70–99)
Glucose-Capillary: 123 mg/dL — ABNORMAL HIGH (ref 70–99)
Glucose-Capillary: 131 mg/dL — ABNORMAL HIGH (ref 70–99)
Glucose-Capillary: 132 mg/dL — ABNORMAL HIGH (ref 70–99)
Glucose-Capillary: 138 mg/dL — ABNORMAL HIGH (ref 70–99)
Glucose-Capillary: 169 mg/dL — ABNORMAL HIGH (ref 70–99)
Glucose-Capillary: 193 mg/dL — ABNORMAL HIGH (ref 70–99)
Glucose-Capillary: 99 mg/dL (ref 70–99)

## 2019-05-03 LAB — CBC
HCT: 39.3 % (ref 39.0–52.0)
Hemoglobin: 11.9 g/dL — ABNORMAL LOW (ref 13.0–17.0)
MCH: 28.5 pg (ref 26.0–34.0)
MCHC: 30.3 g/dL (ref 30.0–36.0)
MCV: 94 fL (ref 80.0–100.0)
Platelets: 287 10*3/uL (ref 150–400)
RBC: 4.18 MIL/uL — ABNORMAL LOW (ref 4.22–5.81)
RDW: 13.3 % (ref 11.5–15.5)
WBC: 7 10*3/uL (ref 4.0–10.5)
nRBC: 0 % (ref 0.0–0.2)

## 2019-05-03 LAB — URINE CULTURE: Culture: NO GROWTH

## 2019-05-03 LAB — MAGNESIUM: Magnesium: 2.1 mg/dL (ref 1.7–2.4)

## 2019-05-03 LAB — C-PEPTIDE: C-Peptide: 0.3 ng/mL — ABNORMAL LOW (ref 1.1–4.4)
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
## 2019-05-03 NOTE — BH Assessment (Signed)
[facility] Assessment Progress Note    Per Shuvon Rankin, NP, patient does not meet inpatient admission criteria and he has been psych cleared

## 2019-05-03 NOTE — Progress Notes (Signed)
D10 infusion held since about 0800 this morning per MD's verbal instruction. CBG at 1005 resulted as 123. MD made aware.  Quita Skye, RN

## 2019-05-03 NOTE — Progress Notes (Signed)
PROGRESS NOTE    Darren West  YTK:[id number] DOB: 24-Sep-1955 DOA: 05/01/2019 PCP: Patient, No Pcp Per   Brief Narrative:  Per HPI: Darren West a 64 y.o.malewith a history of diabetes, atrial fibrillation, schizophrenia, hypertension, right BKA. Patient was found down at a hotel. Patient was unresponsive and continues to be unresponsive. Upon arrival, his blood sugar was noted to be in the 50s. The patient was given an amp of D50 which momentarily improve the patient's blood sugars, but his blood sugars return to the 50s. More D50 was given,but with similar results: None brief increase in blood sugars, then returned to 50s. Patient was placed on BiPAP to maintain patient'soxygenation. Patient becomes alert with stimulation, but has not had meaningful conversation.  4/25: Patient was admitted with acute metabolic encephalopathy related to persistent hypoglycemia requiring D10 IV infusion.  He was also noted to be hypothermic.  It appears that he might have overdosed on his home insulin and whether this was intentional or not remains to be seen.  C-peptide has been ordered.  Patient appears to be slightly more alert and diet will be initiated.  Plan to discontinue D10 IV fluid as blood glucose begins to elevate.  4/26: Patient's blood glucose levels have improved and D10 infusion has been held. He appears to be eating and his mentation is improving as well. Further history obtained today and it appears that he recently moved from [place] back in February of this year and has not been monitoring his blood glucose regularly at home, but yet taking his long-acting insulin. TTS evaluation ordered and pending today. CSW notified regarding need to establish care in the area as well as have safe discharge plan. Plan to transfer to telemetry later this evening if blood glucose continues to remain stable.  Assessment & Plan:   Principal Problem:   Hypoglycemia Active Problems:   Uncontrolled  diabetes mellitus (HCC)   Essential hypertension   S/P BKA (below knee amputation) unilateral, right (HCC)   Atrial fibrillation, chronic (HCC)   Acute metabolic encephalopathy   Hypokalemia   Acute metabolic encephalopathy secondary to severe-persistent hypoglycemia, resolving -Query insulin overdose intentional versus nonintentional -Appears to have a component of noncompliance with checking blood glucose at home -Patient appears to be showing some signs of improvement and therefore diet has been ordered -Holding D10 infusion for now and if blood glucose continues to remain stable, may transfer to telemetry -Continue to monitor CBGs closely -C-peptide ordered and pending  Hypothermia secondary to above-resolved  Hypokalemia-resolved -Recheck in a.m.  Atrial fibrillation-currently NSR -Continue on Coreg and monitor on telemetry -Uncertain why he is not on blood thinners, but he appears to be high risk for any blood thinners given what appears to be medication noncompliance and high fall risk  Hypertension-stable -Continue on Coreg and Imdur  DVT prophylaxis:Lovenox Code Status: Full Family Communication: None at bedside; no contacts listed Disposition Plan:  Plan for TTS evaluation today with C-peptide pending continue on diet. CSW notified regarding significant barriers for discharge.   Consultants:   None  Procedures:   None  Antimicrobials:   None  Subjective: Patient seen and evaluated today with improved alertness noted. He is tolerating diet and appears less confused. He is able to give some meaningful history. His blood glucose remained stable off D10 infusion.  Objective: Vitals:   05/03/19 1000 05/03/19 1100 05/03/19 1200 05/03/19 1208  BP: 126/86  (!) 93/59   Pulse: 89  82   Resp: 15  Temp:    97.9 F (36.6 C)  TempSrc:    Axillary  SpO2: 97% 100% 100%   Weight:      Height:        Intake/Output Summary (Last 24 hours) at 05/03/2019  1249 Last data filed at 05/03/2019 1100 Gross per 24 hour  Intake 4200.55 ml  Output 3675 ml  Net 525.55 ml   Filed Weights   05/01/19 2319 05/03/19 0500  Weight: (!) 137.1 kg (!) 139.4 kg    Examination:  General exam: Appears calm and comfortable, still sluggish and confused Respiratory system: Clear to auscultation. Respiratory effort normal. On nasal cannula oxygen. Cardiovascular system: S1 & S2 heard, RRR. No JVD, murmurs, rubs, gallops or clicks. No pedal edema. Gastrointestinal system: Abdomen is nondistended, soft and nontender. No organomegaly or masses felt. Normal bowel sounds heard. Central nervous system: Alert and awake. Extremities: Symmetric 5 x 5 power. Right BKA. Skin: No rashes, lesions or ulcers Psychiatry: Flat affect    Data Reviewed: I have personally reviewed following labs and imaging studies  CBC: Recent Labs  Lab 05/01/19 1534 05/02/19 1310 05/03/19 0359  WBC 7.7 9.1 7.0  NEUTROABS 5.2 6.6  --   HGB 13.6 11.5* 11.9*  HCT 43.5 36.9* 39.3  MCV 92.8 93.4 94.0  PLT 299 252 287   Basic Metabolic Panel: Recent Labs  Lab 05/01/19 1534 05/02/19 0427 05/03/19 0359  NA 142 140 138  K 3.4* 4.0 3.9  CL 108 106 103  CO2 27 27 28   GLUCOSE 25* 72 102*  BUN 22 20 14   CREATININE 0.80 0.81 0.97  CALCIUM 8.7* 8.3* 8.5*  MG 2.2  --  2.1   GFR: Estimated Creatinine Clearance: 112.8 mL/min (by C-G formula based on SCr of 0.97 mg/dL). Liver Function Tests: Recent Labs  Lab 05/01/19 1534  AST 27  ALT 20  ALKPHOS 120  BILITOT 0.4  PROT 7.6  ALBUMIN 3.1*   No results for input(s): LIPASE, AMYLASE in the last 168 hours. Recent Labs  Lab 05/01/19 1620  AMMONIA 24   Coagulation Profile: No results for input(s): INR, PROTIME in the last 168 hours. Cardiac Enzymes: No results for input(s): CKTOTAL, CKMB, CKMBINDEX, TROPONINI in the last 168 hours. BNP (last 3 results) No results for input(s): PROBNP in the last 8760 hours. HbA1C: No  results for input(s): HGBA1C in the last 72 hours. CBG: Recent Labs  Lab 05/03/19 0138 05/03/19 0358 05/03/19 0738 05/03/19 1005 05/03/19 1151  GLUCAP 131* 102* 99 123* 132*   Lipid Profile: No results for input(s): CHOL, HDL, LDLCALC, TRIG, CHOLHDL, LDLDIRECT in the last 72 hours. Thyroid Function Tests: No results for input(s): TSH, T4TOTAL, FREET4, T3FREE, THYROIDAB in the last 72 hours. Anemia Panel: No results for input(s): VITAMINB12, FOLATE, FERRITIN, TIBC, IRON, RETICCTPCT in the last 72 hours. Sepsis Labs: Recent Labs  Lab 05/01/19 1534  LATICACIDVEN 0.8    Recent Results (from the past 240 hour(s))  Blood Cultures (routine x 2)     Status: None (Preliminary result)   Collection Time: 05/01/19  3:34 PM   Specimen: BLOOD LEFT HAND  Result Value Ref Range Status   Specimen Description   Final    BLOOD LEFT HAND BOTTLES DRAWN AEROBIC AND ANAEROBIC   Special Requests Blood Culture adequate volume  Final   Culture   Final    NO GROWTH 2 DAYS Performed at [facility], [address], [place], [address] [place]    Report Status PENDING  Incomplete  Urine culture     Status: None   Collection Time: 05/01/19  3:49 PM   Specimen: Urine, Clean Catch  Result Value Ref Range Status   Specimen Description   Final    URINE, CLEAN CATCH Performed at [facility], [address], [place], [place] [address]    Special Requests   Final    NONE Performed at [facility], [address], [place], [place] [address]    Culture   Final    NO GROWTH Performed at [facility], [address], [place], [place] [address]    Report Status 05/03/2019 FINAL  Final  Blood Cultures (routine x 2)     Status: None (Preliminary result)   Collection Time: 05/01/19  4:06 PM   Specimen: BLOOD RIGHT HAND  Result Value Ref Range Status   Specimen Description BLOOD RIGHT HAND BOTTLES DRAWN AEROBIC ONLY  Final   Special Requests   Final    Blood Culture results may not be optimal due to  an inadequate volume of blood received in culture bottles   Culture   Final    NO GROWTH 2 DAYS Performed at [facility], [address], [place], [place] [address]    Report Status PENDING  Incomplete  Respiratory Panel by RT PCR (Flu A&B, Covid) - Nasopharyngeal Swab     Status: None   Collection Time: 05/01/19  7:18 PM   Specimen: Nasopharyngeal Swab  Result Value Ref Range Status   SARS Coronavirus 2 by RT PCR NEGATIVE NEGATIVE Final    Comment: (NOTE) SARS-CoV-2 target nucleic acids are NOT DETECTED. The SARS-CoV-2 RNA is generally detectable in upper respiratoy specimens during the acute phase of infection. The lowest concentration of SARS-CoV-2 viral copies this assay can detect is 131 copies/mL. A negative result does not preclude SARS-Cov-2 infection and should not be used as the sole basis for treatment or other patient management decisions. A negative result may occur with  improper specimen collection/handling, submission of specimen other than nasopharyngeal swab, presence of viral mutation(s) within the areas targeted by this assay, and inadequate number of viral copies (<131 copies/mL). A negative result must be combined with clinical observations, patient history, and epidemiological information. The expected result is Negative. Fact Sheet for Patients:  https://www.moore.com/ Fact Sheet for Healthcare Providers:  https://www.young.biz/ This test is not yet ap proved or cleared by the [place] FDA and  has been authorized for detection and/or diagnosis of SARS-CoV-2 by FDA under an Emergency Use Authorization (EUA). This EUA will remain  in effect (meaning this test can be used) for the duration of the COVID-19 declaration under Section 564(b)(1) of the Act, 21 U.S.C. section 360bbb-3(b)(1), unless the authorization is terminated or revoked sooner.    Influenza A by PCR NEGATIVE NEGATIVE Final   Influenza B by PCR  NEGATIVE NEGATIVE Final    Comment: (NOTE) The Xpert Xpress SARS-CoV-2/FLU/RSV assay is intended as an aid in  the diagnosis of influenza from Nasopharyngeal swab specimens and  should not be used as a sole basis for treatment. Nasal washings and  aspirates are unacceptable for Xpert Xpress SARS-CoV-2/FLU/RSV  testing. Fact Sheet for Patients: https://www.moore.com/ Fact Sheet for Healthcare Providers: https://www.young.biz/ This test is not yet approved or cleared by the [place] FDA and  has been authorized for detection and/or diagnosis of SARS-CoV-2 by  FDA under an Emergency Use Authorization (EUA). This EUA will remain  in effect (meaning this test can be used) for the duration  of the  Covid-19 declaration under Section 564(b)(1) of the Act, 21  U.S.C. section 360bbb-3(b)(1), unless the authorization is  terminated or revoked. Performed at [facility], [address] [place], [place] [address]   MRSA PCR Screening     Status: None   Collection Time: 05/01/19 10:40 PM   Specimen: Nasal Mucosa; Nasopharyngeal  Result Value Ref Range Status   MRSA by PCR NEGATIVE NEGATIVE Final    Comment:        The GeneXpert MRSA Assay (FDA approved for NASAL specimens only), is one component of a comprehensive MRSA colonization surveillance program. It is not intended to diagnose MRSA infection nor to guide or monitor treatment for MRSA infections. Performed at [facility], [address] [place], [place] [address]          Radiology Studies: CT HEAD WO CONTRAST  Result Date: 05/01/2019 CLINICAL DATA:  Altered mental status, possible ingestion EXAM: CT HEAD WITHOUT CONTRAST TECHNIQUE: Contiguous axial images were obtained from the base of the skull through the vertex without intravenous contrast. COMPARISON:  03/27/2019 FINDINGS: Brain: No evidence of acute infarction, hemorrhage, hydrocephalus, extra-axial collection or mass lesion/mass  effect. Periventricular white matter hypodensity. Vascular: No hyperdense vessel or unexpected calcification. Skull: Normal. Negative for fracture or focal lesion. Sinuses/Orbits: No acute finding. Other: None. IMPRESSION: No acute intracranial pathology.  Small-vessel white matter disease. Electronically Signed   By: Eddie Candle M.D.   On: 05/01/2019 18:05   DG Chest Port 1 View  Result Date: 05/01/2019 CLINICAL DATA:  Altered level of consciousness. EXAM: PORTABLE CHEST 1 VIEW COMPARISON:  03/28/2019 and prior radiographs FINDINGS: Cardiomegaly and pulmonary vascular congestion noted. Bibasilar atelectasis noted. There is no evidence of focal airspace disease, pulmonary edema, suspicious pulmonary nodule/mass, pleural effusion, or pneumothorax. No acute bony abnormalities are identified. IMPRESSION: Cardiomegaly with pulmonary vascular congestion and bibasilar atelectasis. Electronically Signed   By: Margarette Canada M.D.   On: 05/01/2019 18:07        Scheduled Meds: . baclofen  10 mg Oral Q6H  . carvedilol  12.5 mg Oral Q12H  . Chlorhexidine Gluconate Cloth  6 each Topical Daily  . DULoxetine  30 mg Oral BID  . enoxaparin (LOVENOX) injection  40 mg Subcutaneous QHS  . fluticasone furoate-vilanterol  1 puff Inhalation Daily  . insulin aspart  0-15 Units Subcutaneous Q4H  . isosorbide mononitrate  30 mg Oral Daily  . nystatin   Topical BID   Continuous Infusions:   LOS: 2 days    Time spent: 35 minutes    Jori Thrall Darleen Crocker, DO [facility]  If 7PM-7AM, please contact night-coverage [email or web address] 05/03/2019, 12:49 PM

## 2019-05-04 DIAGNOSIS — E162 Hypoglycemia, unspecified: Secondary | ICD-10-CM | POA: Diagnosis not present

## 2019-05-04 LAB — MAGNESIUM: Magnesium: 2.1 mg/dL (ref 1.7–2.4)

## 2019-05-04 LAB — BASIC METABOLIC PANEL
Anion gap: 7 (ref 5–15)
BUN: 15 mg/dL (ref 8–23)
CO2: 31 mmol/L (ref 22–32)
Calcium: 8.6 mg/dL — ABNORMAL LOW (ref 8.9–10.3)
Chloride: 102 mmol/L (ref 98–111)
Creatinine, Ser: 0.85 mg/dL (ref 0.61–1.24)
GFR calc Af Amer: >60 mL/min (ref >60)
GFR calc non Af Amer: >60 mL/min (ref >60)
Glucose, Bld: 123 mg/dL — ABNORMAL HIGH (ref 70–99)
Potassium: 4.1 mmol/L (ref 3.5–5.1)
Sodium: 140 mmol/L (ref 135–145)

## 2019-05-04 LAB — GLUCOSE, CAPILLARY
Glucose-Capillary: 115 mg/dL — ABNORMAL HIGH (ref 70–99)
Glucose-Capillary: 139 mg/dL — ABNORMAL HIGH (ref 70–99)

## 2019-05-04 MED ORDER — DOXYCYCLINE HYCLATE 100 MG PO TABS: 100.0000 mg | ORAL_TABLET | Freq: Two times a day (BID) | ORAL | 0 refills | Status: AC

## 2019-05-04 MED ORDER — FLUTICASONE FUROATE-VILANTEROL 200-25 MCG/INH IN AEPB: 1.0000 | INHALATION_SPRAY | Freq: Every day | RESPIRATORY_TRACT | Status: DC

## 2019-05-04 MED ORDER — BLOOD GLUCOSE MONITOR KIT: PACK | 0 refills | Status: AC

## 2019-05-04 MED ORDER — AMOXICILLIN-POT CLAVULANATE 875-125 MG PO TABS: 1.0000 | ORAL_TABLET | Freq: Two times a day (BID) | ORAL | 0 refills | Status: AC

## 2019-05-04 NOTE — Progress Notes (Signed)
Inpatient Diabetes Program Recommendations  AACE/ADA: New Consensus Statement on Inpatient Glycemic Control (2015)  Target Ranges:  Prepandial:   less than 140 mg/dL      Peak postprandial:   less than 180 mg/dL (1-2 hours)      Critically ill patients:  140 - 180 mg/dL   Lab Results  Component Value Date   GLUCAP 139 (H) 05/04/2019   HGBA1C 12.0 (H) 03/28/2019    Review of Glycemic Control  Results for BARI, HANDSHOE (MRN [id number]) as of 05/04/2019 11:05  Ref. Range 05/03/2019 15:10 05/03/2019 16:40 05/03/2019 19:53 05/03/2019 23:50 05/04/2019 03:45  Glucose-Capillary Latest Ref Range: 70 - 99 mg/dL 883 (H) 374 (H) 451 (H) 193 (H) 115 (H)    Diabetes history: DM2 Outpatient Diabetes medications: Lantus 42 units QD, Novolog 25 units TID Current orders for Inpatient glycemic control: Novolog 0-15 Q4H  Note:  Diabetes referral for diabetes outpatient management.  Called to speak with patient regarding DM management and outpatient needs.  Patient has been discharged.   Thank you, Dulce Sellar, RN, BSN Diabetes Coordinator Inpatient Diabetes Program [phone] (team pager from 8a-5p)

## 2019-05-04 NOTE — TOC Transition Note (Signed)
Transition of Care [facility]) - CM/SW Discharge Note   Patient Details  Name: Darren West MRN: [id number] Date of Birth: 07/01/1955  [facility] CM/SW Contact:  Leitha Bleak, RN Phone Number: 05/04/2019, 10:55 AM   Clinical Narrative:   TOC consult. For PCP and transportation. Patient was admitted with hypoglycemia. TOC at the bedside to confirm patient has a room at [facility] hotel to discharge.  Patient states he just needs his prescription and call him a cab. He plans to go to get his wheelchair and go to [facility] to get his prescription.Patient has not paid rent for this week. TOC offering to confirm that he has a room. Patient states, " this is none of your dam business, just call me a cab."    TOC discuss the importance of patient picking up a blood glucose meter while at [facility], patient states he understands. RN updated, and will call a Cab when discharge order is placed.  TOC provided PCP list.       Final next level of care: Home/Self Care Barriers to Discharge: Barriers Resolved   Patient Goals and CMS Choice Patient states their goals for this hospitalization and ongoing recovery are:: to go home. CMS Medicare.gov Compare Post Acute Care list provided to:: Patient Choice offered to / list presented to : Patient  Discharge Placement       Patient and family notified of of transfer: 05/04/19  Patient is staying at [facility]

## 2019-05-04 NOTE — Discharge Summary (Signed)
Physician Discharge Summary  Darren West OAC:[id number] DOB: 03-Jun-1955 DOA: 05/01/2019  PCP: Patient, No Pcp Per  Admit date: 05/01/2019  Discharge date: 05/04/2019  [facility]  Disposition[facility]  Recommendations for Outpatient Follow-up:  1. Follow up with PCP in 1-2 weeks 2. Continue to check blood glucose regularly at home with blood glucose monitoring kit prescribed 3. Finish previously prescribed course of doxycycline and Augmentin for suspected abscess on right BKA stump 4. Continue other home medications as prior  Home Health: None  Equipment/Devices: None  Discharge Condition: Stable  CODE STATUS: Full  Diet recommendation: Heart Healthy/carb modified  Brief/Interim Summary: Per HPI: Darren West a 64 y.o.malewith a history of diabetes, atrial fibrillation, schizophrenia, hypertension, right BKA. Patient was found down at a hotel. Patient was unresponsive and continues to be unresponsive. Upon arrival, his blood sugar was noted to be in the 50s. The patient was given an amp of D50 which momentarily improve the patient's blood sugars, but his blood sugars return to the 50s. More D50 was given,but with similar results: None brief increase in blood sugars, then returned to 50s. Patient was placed on BiPAP to maintain patient'soxygenation. Patient becomes alert with stimulation, but has not had meaningful conversation.  4/25:Patient was admitted with acute metabolic encephalopathy related to persistent hypoglycemia requiring D10 IV infusion. He was also noted to be hypothermic. It appears that he might have overdosed on his home insulin and whether this was intentional or not remains to be seen. C-peptide has been ordered. Patient appears to be slightly more alert and diet will be initiated. Plan to discontinue D10 IV fluid as blood glucose begins to elevate.  4/26: Patient's blood glucose levels have improved and D10 infusion has been held. He  appears to be eating and his mentation is improving as well. Further history obtained today and it appears that he recently moved from [place] back in February of this year and has not been monitoring his blood glucose regularly at home, but yet taking his long-acting insulin. TTS evaluation ordered and pending today. CSW notified regarding need to establish care in the area as well as have safe discharge plan. Plan to transfer to telemetry later this evening if blood glucose continues to remain stable.  4/27: Patient's blood glucose levels have remained quite stable and he is tolerating diet.  He has been evaluated by TTS with no noted suicidal intent or any other concerns noted.  He does have history of schizophrenia that he follows up for at the [place].  He is also noted to have some depression.  He states that he does well living at the motel and has money to transport himself and get medications.  He simply needs blood glucose monitoring at home and close follow-up with PCP to ensure blood glucose levels remained stable.  His recent hemoglobin A1c was noted to be approximately 12% and he needs better management of his blood glucose.  His C-peptide level was not elevated.  He is stable for discharge.  Discharge Diagnoses:  Principal Problem:   Hypoglycemia Active Problems:   Uncontrolled diabetes mellitus ([place])   Essential hypertension   S/P BKA (below knee amputation) unilateral, right (HCC)   Atrial fibrillation, chronic (HCC)   Acute metabolic encephalopathy   Hypokalemia  Principal discharge diagnosis: Acute metabolic encephalopathy secondary to severe hypoglycemia from insulin overdose.  Discharge Instructions  Discharge Instructions    Diet - low sodium heart healthy   Complete by: As directed    Increase activity slowly  Complete by: As directed      Allergies as of 05/04/2019   No Known Allergies     Medication List    TAKE these medications   acetaminophen 500 MG  tablet Commonly known as: TYLENOL Take 500 mg by mouth every 6 (six) hours as needed for mild pain.   albuterol 108 (90 Base) MCG/ACT inhaler Commonly known as: VENTOLIN HFA Inhale 2 puffs into the lungs every 6 (six) hours as needed for wheezing or shortness of breath.   amoxicillin-clavulanate 875-125 MG tablet Commonly known as: AUGMENTIN Take 1 tablet by mouth every 12 (twelve) hours.   baclofen 10 MG tablet Commonly known as: LIORESAL Take 10 mg by mouth 4 (four) times daily.   blood glucose meter kit and supplies Kit Dispense based on patient and insurance preference. Use up to four times daily as directed. (FOR ICD-9 250.00, 250.01).   budesonide-formoterol 160-4.5 MCG/ACT inhaler Commonly known as: SYMBICORT Inhale 2 puffs into the lungs 2 (two) times daily.   calcium carbonate 500 MG chewable tablet Commonly known as: TUMS - dosed in mg elemental calcium Chew 1 tablet by mouth 2 (two) times daily as needed for indigestion or heartburn.   carvedilol 12.5 MG tablet Commonly known as: COREG Take 1 tablet (12.5 mg total) by mouth every 12 (twelve) hours.   doxycycline 100 MG tablet Commonly known as: VIBRA-TABS Take 1 tablet (100 mg total) by mouth every 12 (twelve) hours.   DULoxetine 30 MG capsule Commonly known as: CYMBALTA Take 30 mg by mouth 2 (two) times daily.   finasteride 5 MG tablet Commonly known as: PROSCAR Take 5 mg by mouth daily.   gabapentin 300 MG capsule Commonly known as: NEURONTIN Take 600 mg by mouth 2 (two) times daily as needed (pain).   hydrOXYzine 25 MG capsule Commonly known as: VISTARIL Take 25 mg by mouth 3 (three) times daily as needed (sleep).   insulin aspart 100 UNIT/ML injection Commonly known as: novoLOG Inject 25 Units into the skin 3 (three) times daily with meals.   insulin glargine 100 UNIT/ML Solostar Pen Commonly known as: LANTUS Inject 42 Units into the skin daily.   ipratropium-albuterol 0.5-2.5 (3) MG/3ML  Soln Commonly known as: DUONEB Take 3 mLs by nebulization every 4 (four) hours as needed (wheezing/SOB).   isosorbide mononitrate 30 MG 24 hr tablet Commonly known as: IMDUR Take 30 mg by mouth daily.   metFORMIN 500 MG tablet Commonly known as: GLUCOPHAGE Take 500 mg by mouth daily with breakfast.   nicotine polacrilex 2 MG lozenge Commonly known as: COMMIT Take 2 mg by mouth as needed for smoking cessation.   Santyl ointment Generic drug: collagenase Apply 1 application topically daily.   tamsulosin 0.4 MG Caps capsule Commonly known as: FLOMAX Take 0.4 mg by mouth daily after supper.   traZODone 100 MG tablet Commonly known as: DESYREL Take 100 mg by mouth at bedtime as needed for sleep.      Follow-up Information    pcp Follow up in 1 week(s).          No Known Allergies  Consultations:  None   Procedures/Studies: CT HEAD WO CONTRAST  Result Date: 05/01/2019 CLINICAL DATA:  Altered mental status, possible ingestion EXAM: CT HEAD WITHOUT CONTRAST TECHNIQUE: Contiguous axial images were obtained from the base of the skull through the vertex without intravenous contrast. COMPARISON:  03/27/2019 FINDINGS: Brain: No evidence of acute infarction, hemorrhage, hydrocephalus, extra-axial collection or mass lesion/mass effect. Periventricular white matter hypodensity.  Vascular: No hyperdense vessel or unexpected calcification. Skull: Normal. Negative for fracture or focal lesion. Sinuses/Orbits: No acute finding. Other: None. IMPRESSION: No acute intracranial pathology.  Small-vessel white matter disease. Electronically Signed   By: Eddie Candle M.D.   On: 05/01/2019 18:05   DG Chest Port 1 View  Result Date: 05/01/2019 CLINICAL DATA:  Altered level of consciousness. EXAM: PORTABLE CHEST 1 VIEW COMPARISON:  03/28/2019 and prior radiographs FINDINGS: Cardiomegaly and pulmonary vascular congestion noted. Bibasilar atelectasis noted. There is no evidence of focal airspace  disease, pulmonary edema, suspicious pulmonary nodule/mass, pleural effusion, or pneumothorax. No acute bony abnormalities are identified. IMPRESSION: Cardiomegaly with pulmonary vascular congestion and bibasilar atelectasis. Electronically Signed   By: Margarette Canada M.D.   On: 05/01/2019 18:07     Discharge Exam: Vitals:   05/04/19 0757 05/04/19 1016  BP:  133/69  Pulse:  88  Resp:    Temp:    SpO2: 90%    Vitals:   05/04/19 0158 05/04/19 0545 05/04/19 0757 05/04/19 1016  BP: 120/67 123/71  133/69  Pulse: 84 84  88  Resp: 20 20    Temp: 97.9 F (36.6 C) 98.4 F (36.9 C)    TempSrc: Oral Oral    SpO2: 94% 94% 90%   Weight:      Height:        General: Pt is alert, awake, not in acute distress Cardiovascular: RRR, S1/S2 +, no rubs, no gallops Respiratory: CTA bilaterally, no wheezing, no rhonchi Abdominal: Soft, NT, ND, bowel sounds + Extremities: no edema, no cyanosis, right BKA    The results of significant diagnostics from this hospitalization (including imaging, microbiology, ancillary and laboratory) are listed below for reference.     Microbiology: Recent Results (from the past 240 hour(s))  Blood Cultures (routine x 2)     Status: None (Preliminary result)   Collection Time: 05/01/19  3:34 PM   Specimen: BLOOD LEFT HAND  Result Value Ref Range Status   Specimen Description   Final    BLOOD LEFT HAND BOTTLES DRAWN AEROBIC AND ANAEROBIC   Special Requests Blood Culture adequate volume  Final   Culture   Final    NO GROWTH 3 DAYS Performed at [facility], [address], [place], [place] [address]    Report Status PENDING  Incomplete  Urine culture     Status: None   Collection Time: 05/01/19  3:49 PM   Specimen: Urine, Clean Catch  Result Value Ref Range Status   Specimen Description   Final    URINE, CLEAN CATCH Performed at [facility], [address] [place], [place] [address]    Special Requests   Final    NONE Performed at [facility], [address] [place], [place] [address]    Culture   Final    NO GROWTH Performed at [facility], [place] [address], [place], [place] [address]    Report Status 05/03/2019 FINAL  Final  Blood Cultures (routine x 2)     Status: None (Preliminary result)   Collection Time: 05/01/19  4:06 PM   Specimen: BLOOD RIGHT HAND  Result Value Ref Range Status   Specimen Description BLOOD RIGHT HAND BOTTLES DRAWN AEROBIC ONLY  Final   Special Requests   Final    Blood Culture results may not be optimal due to an inadequate volume of blood received in culture bottles   Culture   Final    NO GROWTH 3 DAYS Performed  at [facility], [address] [place], [place] [address]    Report Status PENDING  Incomplete  Respiratory Panel by RT PCR (Flu A&B, Covid) - Nasopharyngeal Swab     Status: None   Collection Time: 05/01/19  7:18 PM   Specimen: Nasopharyngeal Swab  Result Value Ref Range Status   SARS Coronavirus 2 by RT PCR NEGATIVE NEGATIVE Final    Comment: (NOTE) SARS-CoV-2 target nucleic acids are NOT DETECTED. The SARS-CoV-2 RNA is generally detectable in upper respiratoy specimens during the acute phase of infection. The lowest concentration of SARS-CoV-2 viral copies this assay can detect is 131 copies/mL. A negative result does not preclude SARS-Cov-2 infection and should not be used as the sole basis for treatment or other patient management decisions. A negative result may occur with  improper specimen collection/handling, submission of specimen other than nasopharyngeal swab, presence of viral mutation(s) within the areas targeted by this assay, and inadequate number of viral copies (<131 copies/mL). A negative result must be combined with clinical observations, patient history, and epidemiological information. The expected result is Negative. Fact Sheet for Patients:  PinkCheek.be Fact Sheet for Healthcare Providers:   GravelBags.it This test is not yet ap proved or cleared by the [place] FDA and  has been authorized for detection and/or diagnosis of SARS-CoV-2 by FDA under an Emergency Use Authorization (EUA). This EUA will remain  in effect (meaning this test can be used) for the duration of the COVID-19 declaration under Section 564(b)(1) of the Act, 21 U.S.C. section 360bbb-3(b)(1), unless the authorization is terminated or revoked sooner.    Influenza A by PCR NEGATIVE NEGATIVE Final   Influenza B by PCR NEGATIVE NEGATIVE Final    Comment: (NOTE) The Xpert Xpress SARS-CoV-2/FLU/RSV assay is intended as an aid in  the diagnosis of influenza from Nasopharyngeal swab specimens and  should not be used as a sole basis for treatment. Nasal washings and  aspirates are unacceptable for Xpert Xpress SARS-CoV-2/FLU/RSV  testing. Fact Sheet for Patients: PinkCheek.be Fact Sheet for Healthcare Providers: GravelBags.it This test is not yet approved or cleared by the [place] FDA and  has been authorized for detection and/or diagnosis of SARS-CoV-2 by  FDA under an Emergency Use Authorization (EUA). This EUA will remain  in effect (meaning this test can be used) for the duration of the  Covid-19 declaration under Section 564(b)(1) of the Act, 21  U.S.C. section 360bbb-3(b)(1), unless the authorization is  terminated or revoked. Performed at [facility], [address] [place], [place] [address]   MRSA PCR Screening     Status: None   Collection Time: 05/01/19 10:40 PM   Specimen: Nasal Mucosa; Nasopharyngeal  Result Value Ref Range Status   MRSA by PCR NEGATIVE NEGATIVE Final    Comment:        The GeneXpert MRSA Assay (FDA approved for NASAL specimens only), is one component of a comprehensive MRSA colonization surveillance program. It is not intended to diagnose MRSA infection nor to guide  or monitor treatment for MRSA infections. Performed at [facility], [address], [place], [place] [address]      Labs: BNP (last 3 results) No results for input(s): BNP in the last 8760 hours. Basic Metabolic Panel: Recent Labs  Lab 05/01/19 1534 05/02/19 0427 05/03/19 0359 05/04/19 0448  NA 142 140 138 140  K 3.4* 4.0 3.9 4.1  CL 108 106 103 102  CO2 27 27 28 31   GLUCOSE 25* 72 102* 123*  BUN 22 20 14 15   CREATININE 0.80 0.81 0.97 0.85  CALCIUM 8.7* 8.3* 8.5* 8.6*  MG 2.2  --  2.1 2.1   Liver Function Tests: Recent Labs  Lab 05/01/19 1534  AST 27  ALT 20  ALKPHOS 120  BILITOT 0.4  PROT 7.6  ALBUMIN 3.1*   No results for input(s): LIPASE, AMYLASE in the last 168 hours. Recent Labs  Lab 05/01/19 1620  AMMONIA 24   CBC: Recent Labs  Lab 05/01/19 1534 05/02/19 1310 05/03/19 0359  WBC 7.7 9.1 7.0  NEUTROABS 5.2 6.6  --   HGB 13.6 11.5* 11.9*  HCT 43.5 36.9* 39.3  MCV 92.8 93.4 94.0  PLT 299 252 287   Cardiac Enzymes: No results for input(s): CKTOTAL, CKMB, CKMBINDEX, TROPONINI in the last 168 hours. BNP: Invalid input(s): POCBNP CBG: Recent Labs  Lab 05/03/19 1640 05/03/19 1953 05/03/19 2350 05/04/19 0345 05/04/19 0807  GLUCAP 108* 169* 193* 115* 139*   D-Dimer No results for input(s): DDIMER in the last 72 hours. Hgb A1c No results for input(s): HGBA1C in the last 72 hours. Lipid Profile No results for input(s): CHOL, HDL, LDLCALC, TRIG, CHOLHDL, LDLDIRECT in the last 72 hours. Thyroid function studies No results for input(s): TSH, T4TOTAL, T3FREE, THYROIDAB in the last 72 hours.  Invalid input(s): FREET3 Anemia work up No results for input(s): VITAMINB12, FOLATE, FERRITIN, TIBC, IRON, RETICCTPCT in the last 72 hours. Urinalysis    Component Value Date/Time   COLORURINE YELLOW 05/01/2019 1548   APPEARANCEUR CLEAR 05/01/2019 1548   LABSPEC 1.025 05/01/2019 1548   PHURINE 5.0 05/01/2019 1548   GLUCOSEU NEGATIVE 05/01/2019 1548    HGBUR NEGATIVE 05/01/2019 [place] 05/01/2019 1548   KETONESUR NEGATIVE 05/01/2019 1548   PROTEINUR >=300 (A) 05/01/2019 1548   NITRITE NEGATIVE 05/01/2019 1548   LEUKOCYTESUR NEGATIVE 05/01/2019 1548   Sepsis Labs Invalid input(s): PROCALCITONIN,  WBC,  LACTICIDVEN Microbiology Recent Results (from the past 240 hour(s))  Blood Cultures (routine x 2)     Status: None (Preliminary result)   Collection Time: 05/01/19  3:34 PM   Specimen: BLOOD LEFT HAND  Result Value Ref Range Status   Specimen Description   Final    BLOOD LEFT HAND BOTTLES DRAWN AEROBIC AND ANAEROBIC   Special Requests Blood Culture adequate volume  Final   Culture   Final    NO GROWTH 3 DAYS Performed at [facility], [address], [place], [place] [address]    Report Status PENDING  Incomplete  Urine culture     Status: None   Collection Time: 05/01/19  3:49 PM   Specimen: Urine, Clean Catch  Result Value Ref Range Status   Specimen Description   Final    URINE, CLEAN CATCH Performed at [facility], [address] [place], [place] [address]    Special Requests   Final    NONE Performed at [facility], [address] [place], [place] [address]    Culture   Final    NO GROWTH Performed at [facility], [place] [address] [place],  [address]    Report Status 05/03/2019 FINAL  Final  Blood Cultures (routine x 2)     Status: None (Preliminary result)   Collection Time: 05/01/19  4:06 PM   Specimen: BLOOD RIGHT HAND  Result Value Ref Range Status   Specimen Description BLOOD RIGHT HAND BOTTLES DRAWN AEROBIC ONLY  Final   Special Requests   Final    Blood  Culture results may not be optimal due to an inadequate volume of blood received in culture bottles   Culture   Final    NO GROWTH 3 DAYS Performed at [facility], [address] [place], [place] [address]    Report Status PENDING  Incomplete  Respiratory Panel by RT PCR (Flu A&B, Covid) - Nasopharyngeal Swab     Status:  None   Collection Time: 05/01/19  7:18 PM   Specimen: Nasopharyngeal Swab  Result Value Ref Range Status   SARS Coronavirus 2 by RT PCR NEGATIVE NEGATIVE Final    Comment: (NOTE) SARS-CoV-2 target nucleic acids are NOT DETECTED. The SARS-CoV-2 RNA is generally detectable in upper respiratoy specimens during the acute phase of infection. The lowest concentration of SARS-CoV-2 viral copies this assay can detect is 131 copies/mL. A negative result does not preclude SARS-Cov-2 infection and should not be used as the sole basis for treatment or other patient management decisions. A negative result may occur with  improper specimen collection/handling, submission of specimen other than nasopharyngeal swab, presence of viral mutation(s) within the areas targeted by this assay, and inadequate number of viral copies (<131 copies/mL). A negative result must be combined with clinical observations, patient history, and epidemiological information. The expected result is Negative. Fact Sheet for Patients:  PinkCheek.be Fact Sheet for Healthcare Providers:  GravelBags.it This test is not yet ap proved or cleared by the [place] FDA and  has been authorized for detection and/or diagnosis of SARS-CoV-2 by FDA under an Emergency Use Authorization (EUA). This EUA will remain  in effect (meaning this test can be used) for the duration of the COVID-19 declaration under Section 564(b)(1) of the Act, 21 U.S.C. section 360bbb-3(b)(1), unless the authorization is terminated or revoked sooner.    Influenza A by PCR NEGATIVE NEGATIVE Final   Influenza B by PCR NEGATIVE NEGATIVE Final    Comment: (NOTE) The Xpert Xpress SARS-CoV-2/FLU/RSV assay is intended as an aid in  the diagnosis of influenza from Nasopharyngeal swab specimens and  should not be used as a sole basis for treatment. Nasal washings and  aspirates are unacceptable for Xpert  Xpress SARS-CoV-2/FLU/RSV  testing. Fact Sheet for Patients: PinkCheek.be Fact Sheet for Healthcare Providers: GravelBags.it This test is not yet approved or cleared by the [place] FDA and  has been authorized for detection and/or diagnosis of SARS-CoV-2 by  FDA under an Emergency Use Authorization (EUA). This EUA will remain  in effect (meaning this test can be used) for the duration of the  Covid-19 declaration under Section 564(b)(1) of the Act, 21  U.S.C. section 360bbb-3(b)(1), unless the authorization is  terminated or revoked. Performed at [facility], [address] [place], [place] [address]   MRSA PCR Screening     Status: None   Collection Time: 05/01/19 10:40 PM   Specimen: Nasal Mucosa; Nasopharyngeal  Result Value Ref Range Status   MRSA by PCR NEGATIVE NEGATIVE Final    Comment:        The GeneXpert MRSA Assay (FDA approved for NASAL specimens only), is one component of a comprehensive MRSA colonization surveillance program. It is not intended to diagnose MRSA infection nor to guide or monitor treatment for MRSA infections. Performed at [facility], [address], [place], [place] [address]      Time coordinating discharge: 35 minutes  SIGNED:   Rodena Goldmann, DO [facility] 05/04/2019, 10:42 AM  If 7PM-7AM, please contact night-coverage [email or web address]

## 2019-05-04 NOTE — Plan of Care (Signed)

## 2019-05-06 LAB — CULTURE, BLOOD (ROUTINE X 2)
Culture: NO GROWTH
Culture: NO GROWTH
Special Requests: ADEQUATE

## 2019-06-08 DEATH — deceased

## 2021-01-10 IMAGING — CT CT HEAD W/O CM
3 of 6 series · 13 of 47 positions shown, 15 images · non-contrast
Comparison: None.

CLINICAL DATA: Altered mental status

EXAM:
CT HEAD WITHOUT CONTRAST
TECHNIQUE: Contiguous axial images were obtained from the base of the skull
through the vertex without intravenous contrast.

[Series 3: head w o · axial · 0.46mm/px · z∈[-22,+118]mm · 8 of 36 slices shown, 10 images]
[im 4/36  brain]
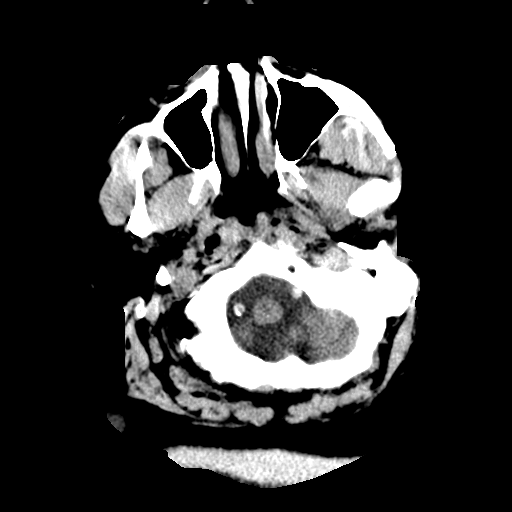
[im 4/36  bone]
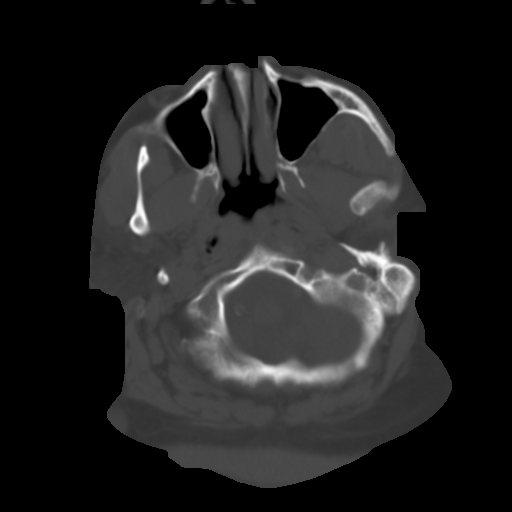
[im 8/36  brain]
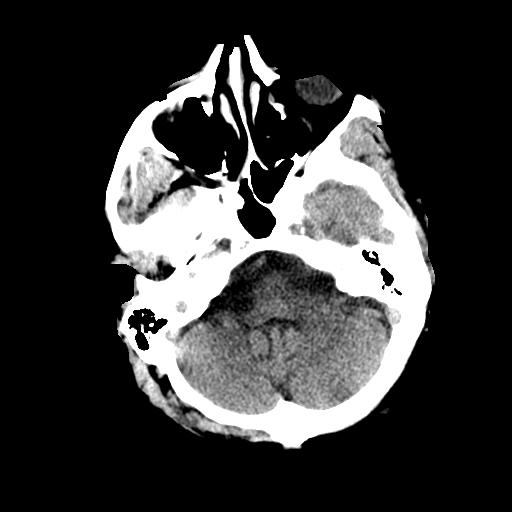
[im 12/36  brain]
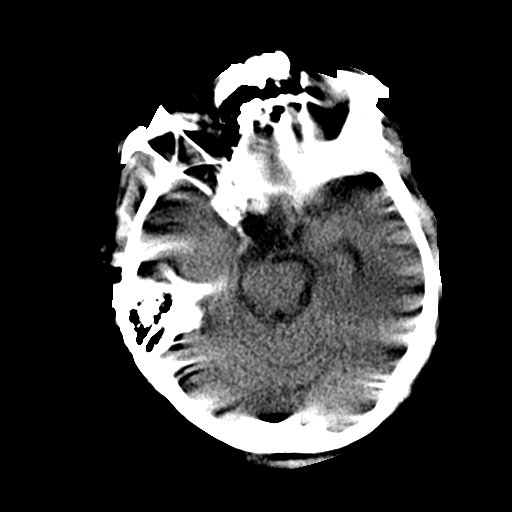
[im 15/36  brain]
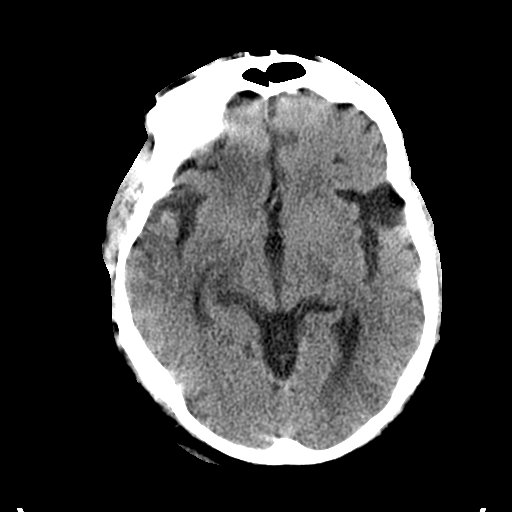
[im 21/36  brain]
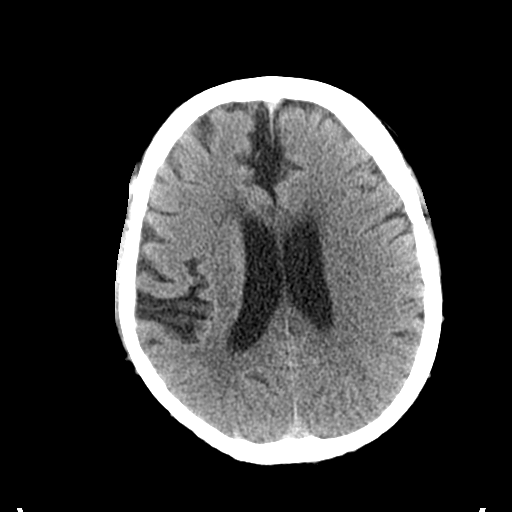
[im 21/36  bone]
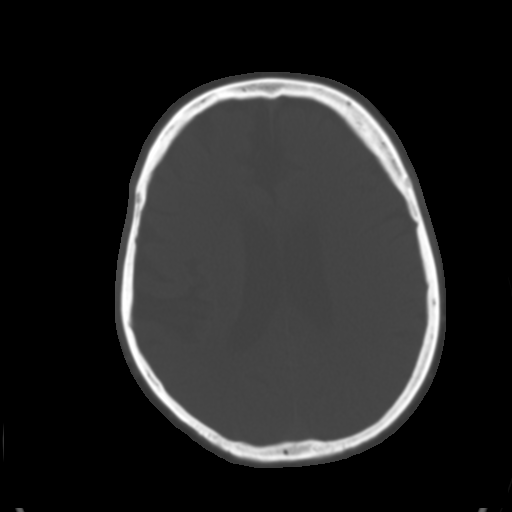
[im 24/36  brain]
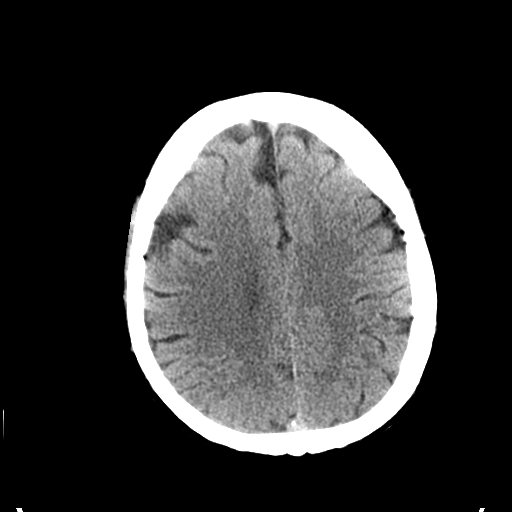
[im 28/36  brain]
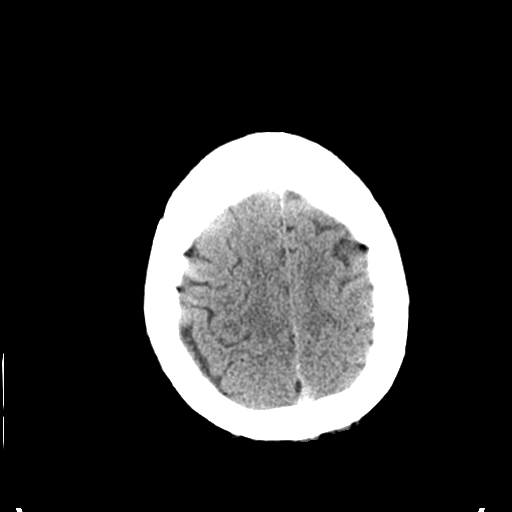
[im 32/36  brain]
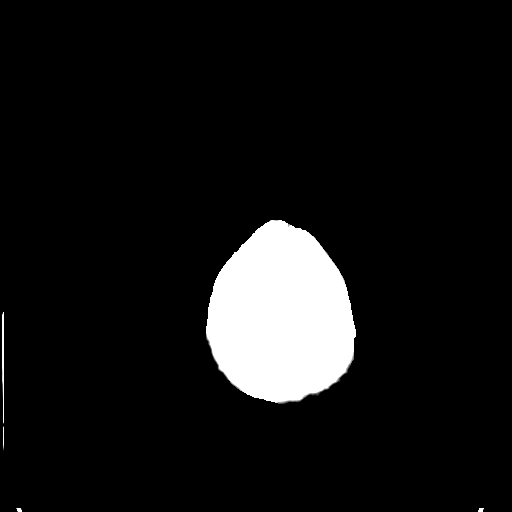

[Series 7: coronal soft · coronal · 0.36mm/px · 3 of 82 slices shown]
[im 13/82  brain]
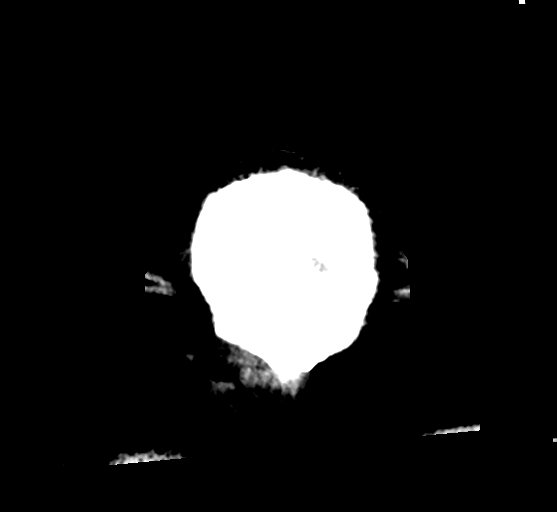
[im 25/82  brain]
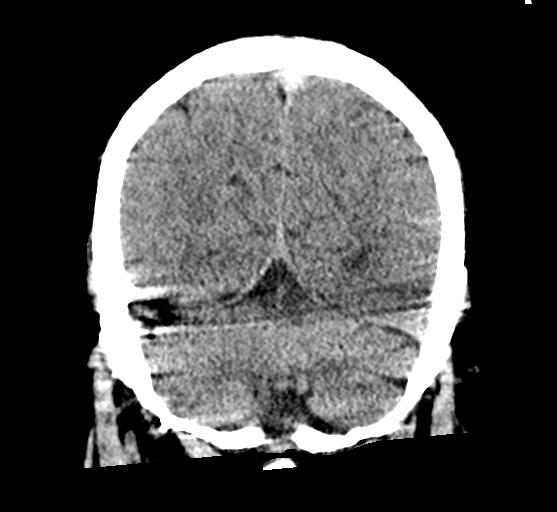
[im 37/82  brain]
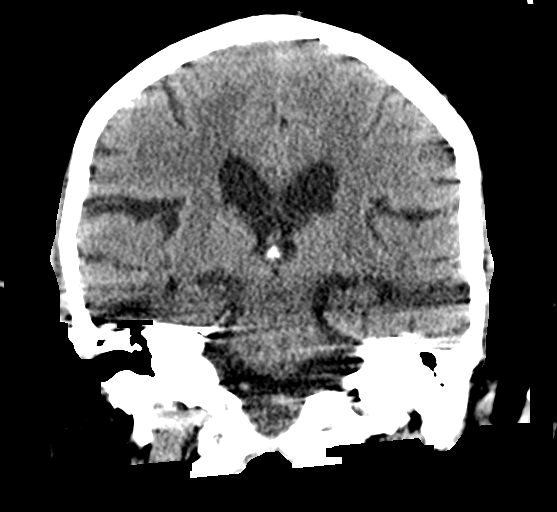

[Series 10: sagittal soft · sagittal · 0.20mm/px · 2 of 67 slices shown]
[im 23/67  brain]
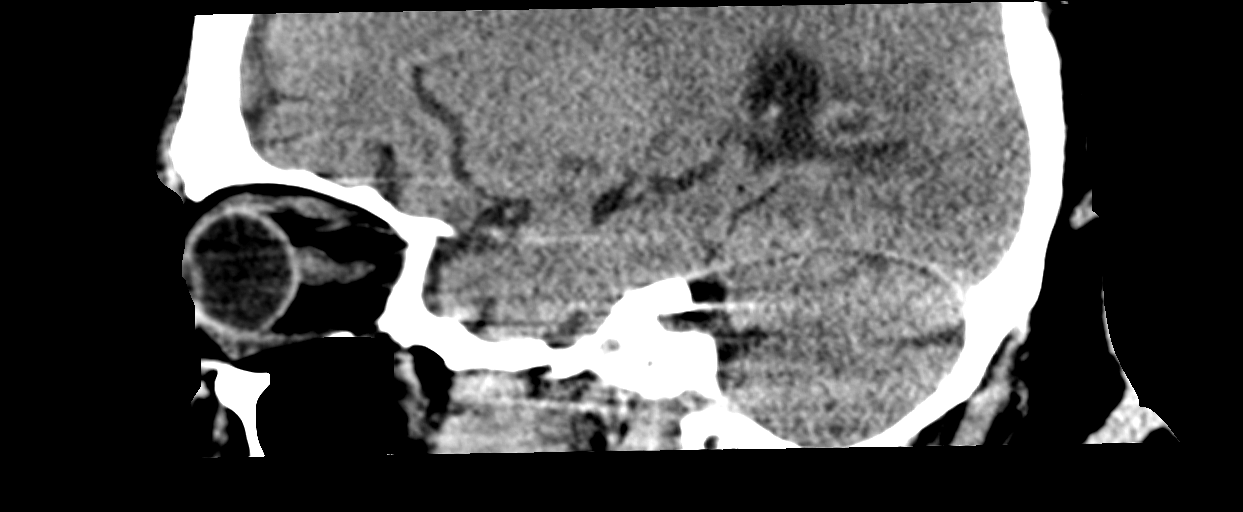
[im 45/67  brain]
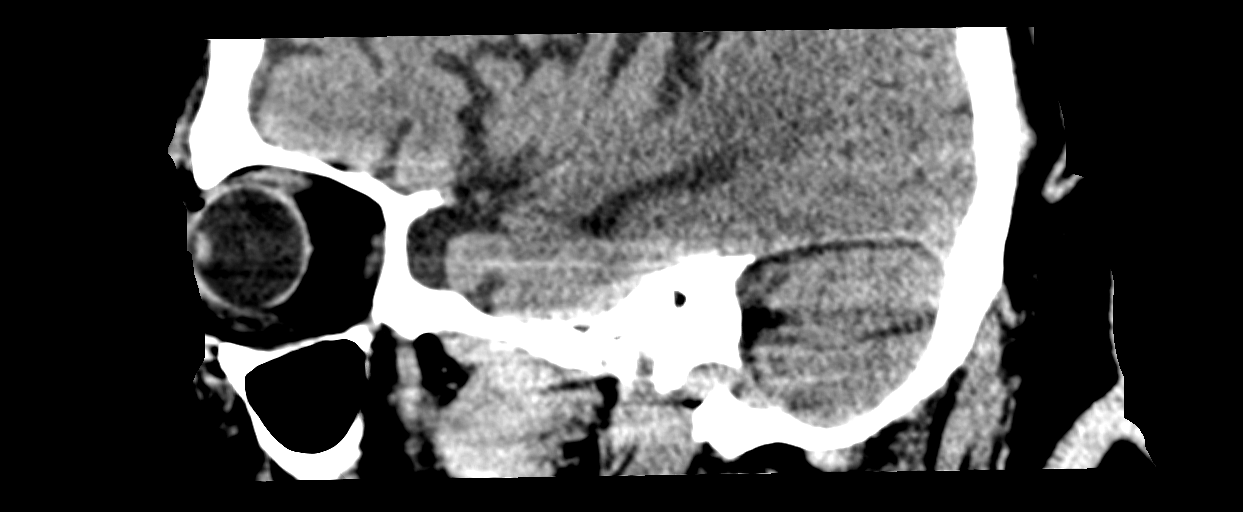

[13 of 47 positions shown; findings below may reference images not displayed]

FINDINGS: Brain: No evidence of acute infarction, hemorrhage, hydrocephalus,
extra-axial collection or mass lesion/mass effect.

Vascular: Atherosclerotic calcifications involving the large vessels
of the skull base. No unexpected hyperdense vessel.

Skull: Normal. Negative for fracture or focal lesion.

Sinuses/Orbits: No acute finding.

Other: Within the subcutaneous soft tissues of the posterior right
neck is a rounded low attenuation structure measuring 1.8 x 1.6 x
1.8 cm (series 3, image 34; series 7, image 66).
IMPRESSION: 1. No acute intracranial pathology.
2. Rounded low attenuation structure in the subcutaneous soft
tissues of the posterior right neck measuring 1.8 cm. This may
represent a sebaceous cyst or other cystic lesion. Correlate with
physical exam.

## 2021-01-10 IMAGING — CT CT ABD-PELV W/ CM
3 of 5 series · 16 of 46 positions shown, 18 images · IV contrast (Omnipaque or Isovue)
Comparison: None.

CLINICAL DATA: Abdominal pain.

EXAM:
CT ABDOMEN AND PELVIS WITH CONTRAST
TECHNIQUE: Multidetector CT imaging of the abdomen and pelvis was performed
using the standard protocol following bolus administration of
intravenous contrast.
CONTRAST:  100mL OMNIPAQUE IOHEXOL 300 MG/ML  SOLN

[Series 3: axial st · axial · 1.27mm/px · z∈[+874,+1354]mm · 10 of 118 slices shown, 12 images]
[im 11/118  soft-tissue]
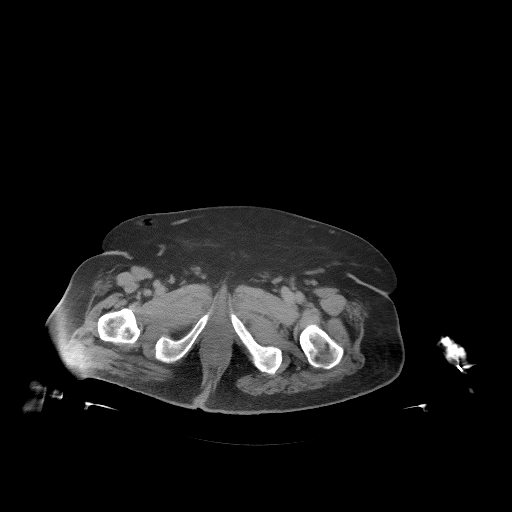
[im 11/118  bone]
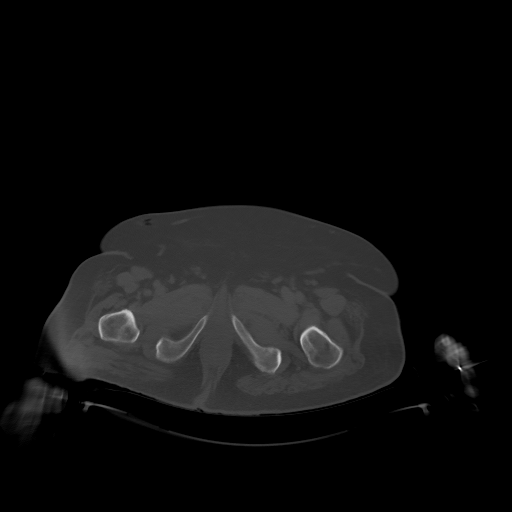
[im 22/118  soft-tissue]
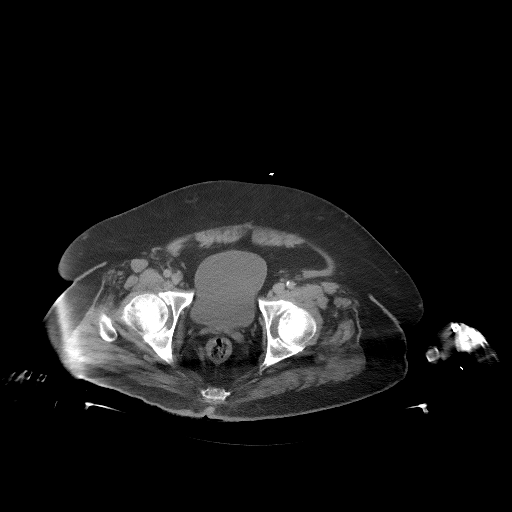
[im 32/118  soft-tissue]
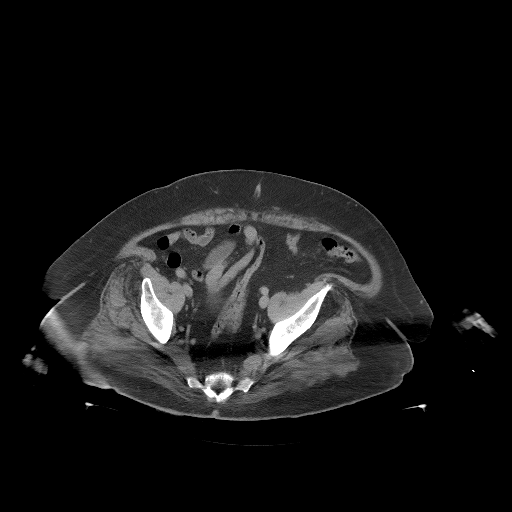
[im 43/118  soft-tissue]
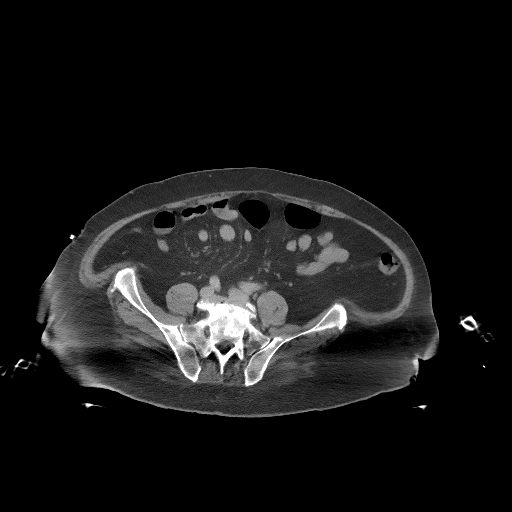
[im 54/118  soft-tissue]
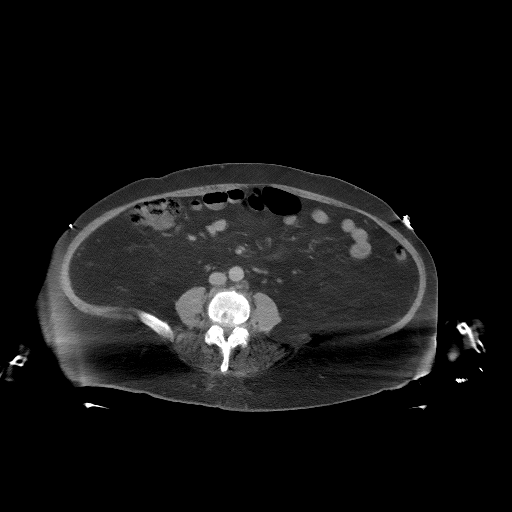
[im 64/118  soft-tissue]
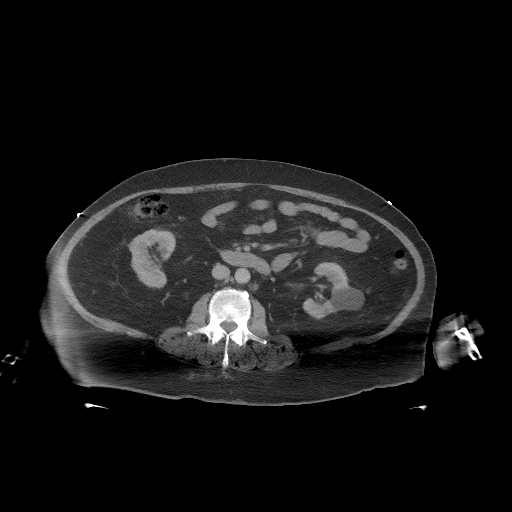
[im 75/118  soft-tissue]
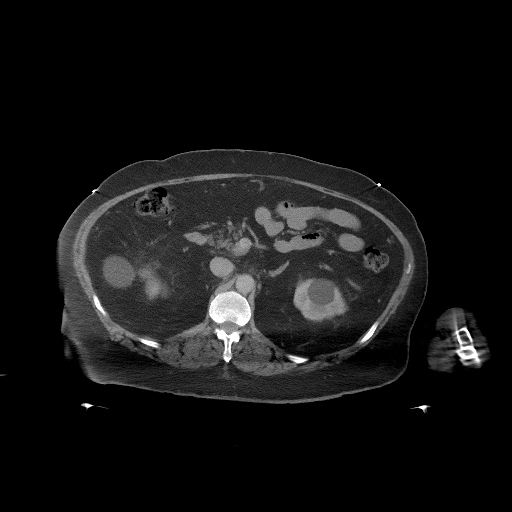
[im 86/118  soft-tissue]
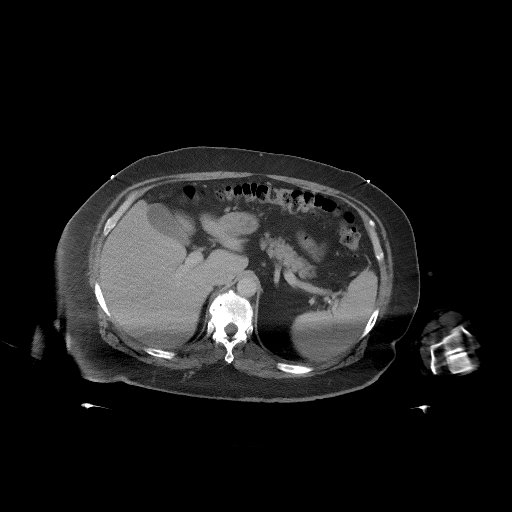
[im 96/118  soft-tissue]
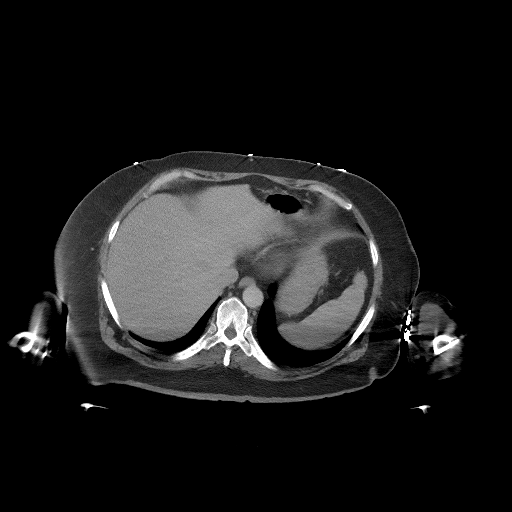
[im 96/118  bone]
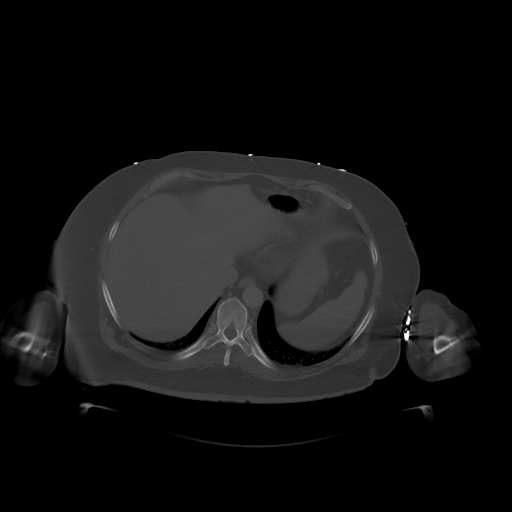
[im 107/118  soft-tissue]
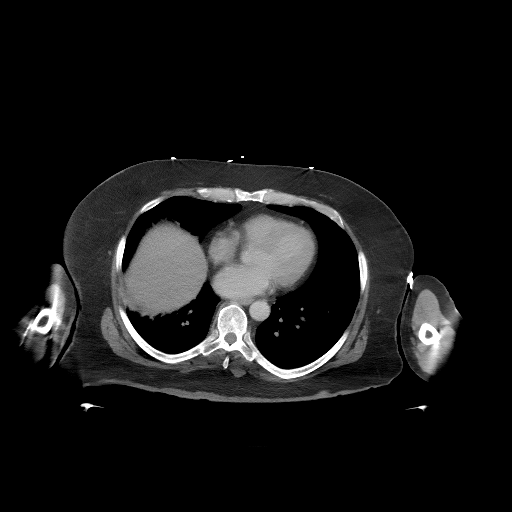

[Series 6: lung bases · axial · 0.98mm/px · z∈[+1171,+1231]mm · 3 of 129 slices shown]
[im 10/129  bone]
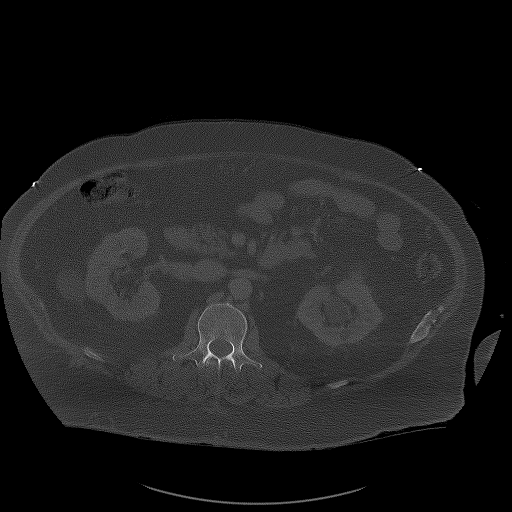
[im 30/129  bone]
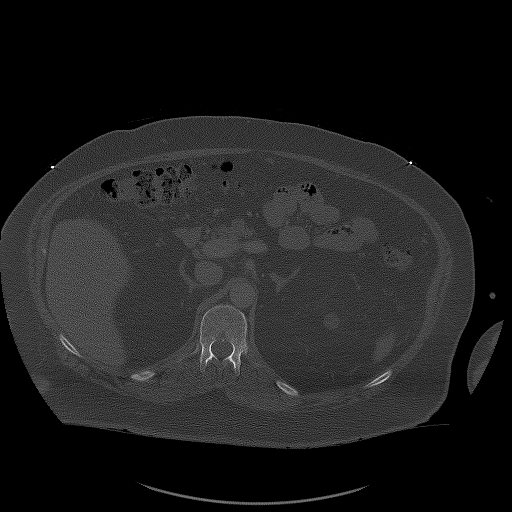
[im 40/129  bone]
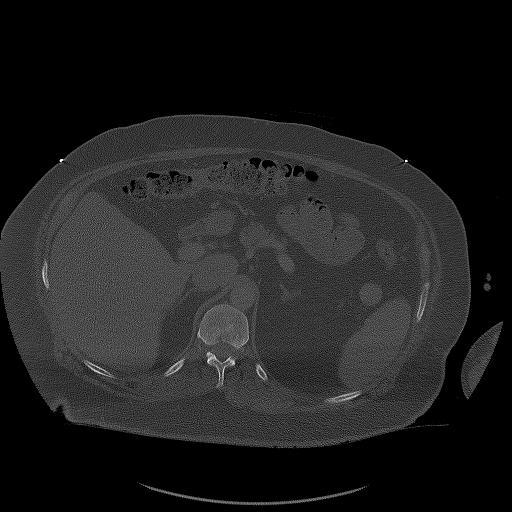

[Series 7: coronal st · coronal · 1.01mm/px · 3 of 120 slices shown]
[im 40/120  soft-tissue]
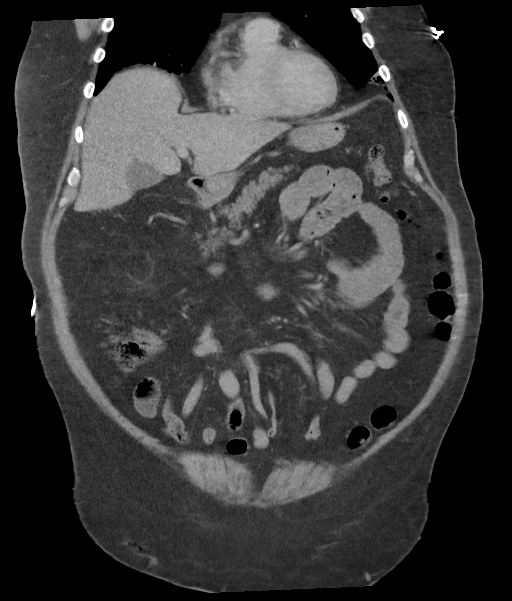
[im 53/120  soft-tissue]
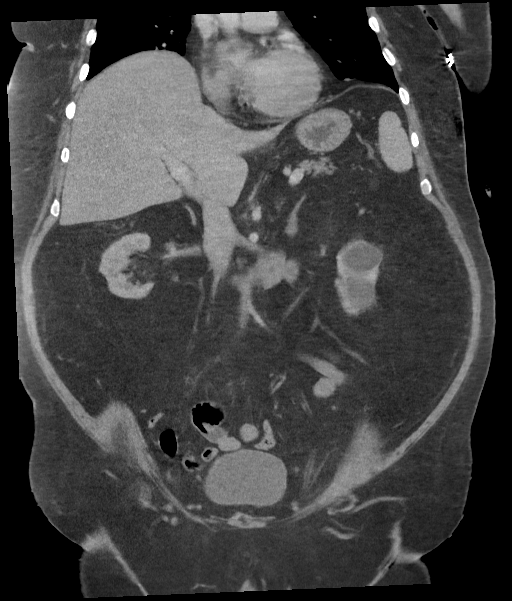
[im 67/120  soft-tissue]
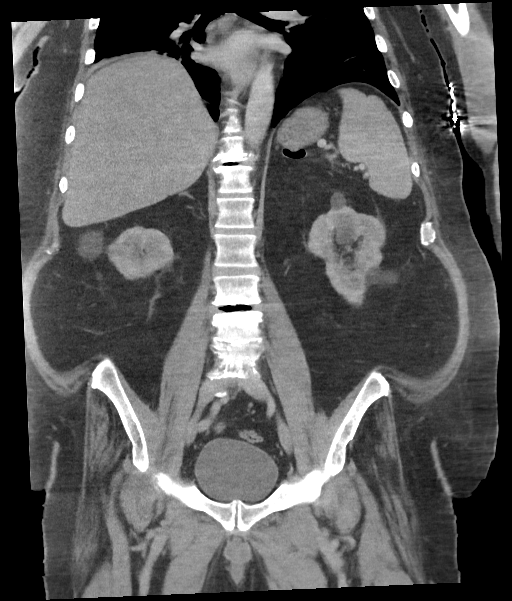

[16 of 46 positions shown; findings below may reference images not displayed]

FINDINGS: Lower chest: Mild bibasilar scarring and/or atelectasis is seen.

Hepatobiliary: No focal liver abnormality is seen. No gallstones,
gallbladder wall thickening, or biliary dilatation.

Pancreas: Unremarkable. No pancreatic ductal dilatation or
surrounding inflammatory changes.

Spleen: Normal in size without focal abnormality.

Adrenals/Urinary Tract: Adrenal glands are unremarkable. Kidneys are
normal in size. A 4.7 cm cyst is seen along the lateral aspect of
the mid right kidney. 3.5 cm, 3.8 cm and 4.3 cm cysts are seen
within the left kidney. A 6 mm nonobstructing renal stone is seen
within the anterior aspect of the mid right kidney. A 5 mm
nonobstructing renal stone is seen within the lower pole of the left
kidney. There is no evidence of hydronephrosis or renal obstruction.
Bladder is unremarkable.

Stomach/Bowel: A 3.4 cm x 2.8 cm gastric diverticulum is seen along
the posteromedial aspect of the body of the stomach. Appendix
appears normal. No evidence of bowel wall thickening, distention, or
inflammatory changes. Noninflamed diverticula are seen within the
large bowel.

Vascular/Lymphatic: No significant vascular findings are present. No
enlarged abdominal or pelvic lymph nodes.

Reproductive: Prostate is unremarkable.

Other: No abdominal wall hernia or abnormality. No abdominopelvic
ascites.

Musculoskeletal: Multilevel degenerative changes seen within the
lumbar spine.
IMPRESSION: 1. Bilateral nonobstructing renal calculi.
2. Noninflamed gastric and colonic diverticula.
3. Multiple bilateral renal cysts.
